# Patient Record
Sex: Male | Born: 1975 | Race: Black or African American | Hispanic: No | Marital: Married | Smoking: Never smoker
Health system: Southern US, Community
[De-identification: ages and names within clinical notes are randomized; demographics above are authoritative.]

## PROBLEM LIST (undated history)

## (undated) DIAGNOSIS — Z789 Other specified health status: Secondary | ICD-10-CM

## (undated) HISTORY — PX: APPENDECTOMY: SHX54

---

## 2016-02-08 ENCOUNTER — Ambulatory Visit
Admission: RE | Admit: 2016-02-08 | Discharge: 2016-02-08 | Disposition: A | Discharge status: Home / Self Care | Payer: BLUE CROSS/BLUE SHIELD | Source: Ambulatory Visit | Attending: Family Medicine | Admitting: Family Medicine

## 2016-02-08 ENCOUNTER — Encounter (HOSPITAL_COMMUNITY): Payer: Self-pay | Admitting: Emergency Medicine

## 2016-02-08 ENCOUNTER — Emergency Department (HOSPITAL_COMMUNITY): Payer: BLUE CROSS/BLUE SHIELD

## 2016-02-08 ENCOUNTER — Other Ambulatory Visit: Payer: Self-pay | Admitting: Family Medicine

## 2016-02-08 ENCOUNTER — Inpatient Hospital Stay (HOSPITAL_COMMUNITY)
Admission: EM | Admit: 2016-02-08 | Discharge: 2016-02-16 | DRG: 988 | Disposition: A | Discharge status: Home / Self Care | Payer: BLUE CROSS/BLUE SHIELD | Attending: Internal Medicine | Admitting: Internal Medicine

## 2016-02-08 DIAGNOSIS — E876 Hypokalemia: Secondary | ICD-10-CM | POA: Diagnosis present

## 2016-02-08 DIAGNOSIS — D6959 Other secondary thrombocytopenia: Secondary | ICD-10-CM | POA: Diagnosis present

## 2016-02-08 DIAGNOSIS — E611 Iron deficiency: Secondary | ICD-10-CM | POA: Diagnosis present

## 2016-02-08 DIAGNOSIS — J9811 Atelectasis: Secondary | ICD-10-CM | POA: Diagnosis present

## 2016-02-08 DIAGNOSIS — R079 Chest pain, unspecified: Secondary | ICD-10-CM

## 2016-02-08 DIAGNOSIS — D509 Iron deficiency anemia, unspecified: Principal | ICD-10-CM | POA: Diagnosis present

## 2016-02-08 DIAGNOSIS — R0789 Other chest pain: Secondary | ICD-10-CM

## 2016-02-08 DIAGNOSIS — D649 Anemia, unspecified: Secondary | ICD-10-CM | POA: Diagnosis present

## 2016-02-08 DIAGNOSIS — R0602 Shortness of breath: Secondary | ICD-10-CM

## 2016-02-08 DIAGNOSIS — K640 First degree hemorrhoids: Secondary | ICD-10-CM | POA: Diagnosis present

## 2016-02-08 DIAGNOSIS — R0902 Hypoxemia: Secondary | ICD-10-CM | POA: Diagnosis present

## 2016-02-08 DIAGNOSIS — R5383 Other fatigue: Secondary | ICD-10-CM | POA: Diagnosis not present

## 2016-02-08 DIAGNOSIS — R918 Other nonspecific abnormal finding of lung field: Secondary | ICD-10-CM | POA: Diagnosis present

## 2016-02-08 DIAGNOSIS — R911 Solitary pulmonary nodule: Secondary | ICD-10-CM | POA: Diagnosis present

## 2016-02-08 DIAGNOSIS — Z9049 Acquired absence of other specified parts of digestive tract: Secondary | ICD-10-CM

## 2016-02-08 DIAGNOSIS — R7989 Other specified abnormal findings of blood chemistry: Secondary | ICD-10-CM

## 2016-02-08 DIAGNOSIS — D696 Thrombocytopenia, unspecified: Secondary | ICD-10-CM

## 2016-02-08 DIAGNOSIS — R109 Unspecified abdominal pain: Secondary | ICD-10-CM

## 2016-02-08 DIAGNOSIS — R791 Abnormal coagulation profile: Secondary | ICD-10-CM | POA: Diagnosis present

## 2016-02-08 HISTORY — DX: Other specified health status: Z78.9

## 2016-02-08 LAB — COMPREHENSIVE METABOLIC PANEL
ALBUMIN: 3.6 g/dL (ref 3.5–5.0)
ALT: 36 U/L (ref 17–63)
ANION GAP: 7 (ref 5–15)
AST: 38 U/L (ref 15–41)
Alkaline Phosphatase: 75 U/L (ref 38–126)
BILIRUBIN TOTAL: 1.3 mg/dL — AB (ref 0.3–1.2)
BUN: 16 mg/dL (ref 6–20)
CHLORIDE: 104 mmol/L (ref 101–111)
CO2: 26 mmol/L (ref 22–32)
Calcium: 9 mg/dL (ref 8.9–10.3)
Creatinine, Ser: 1.08 mg/dL (ref 0.61–1.24)
GFR calc Af Amer: >60 mL/min (ref >60)
GLUCOSE: 112 mg/dL — AB (ref 65–99)
POTASSIUM: 3.6 mmol/L (ref 3.5–5.1)
Sodium: 137 mmol/L (ref 135–145)
TOTAL PROTEIN: 6.6 g/dL (ref 6.5–8.1)

## 2016-02-08 LAB — ABO/RH: ABO/RH(D): O POS

## 2016-02-08 LAB — IRON AND TIBC
IRON: 8 ug/dL — AB (ref 45–182)
Saturation Ratios: 2 % — ABNORMAL LOW (ref 17.9–39.5)
TIBC: 456 ug/dL — AB (ref 250–450)
UIBC: 448 ug/dL

## 2016-02-08 LAB — CBC
HEMATOCRIT: 21.6 % — AB (ref 39.0–52.0)
HEMOGLOBIN: 6.3 g/dL — AB (ref 13.0–17.0)
MCH: 19.4 pg — ABNORMAL LOW (ref 26.0–34.0)
MCHC: 29.2 g/dL — ABNORMAL LOW (ref 30.0–36.0)
MCV: 66.7 fL — ABNORMAL LOW (ref 78.0–100.0)
Platelets: 115 10*3/uL — ABNORMAL LOW (ref 150–400)
RBC: 3.24 MIL/uL — ABNORMAL LOW (ref 4.22–5.81)
RDW: 16.7 % — ABNORMAL HIGH (ref 11.5–15.5)
WBC: 4.1 10*3/uL (ref 4.0–10.5)

## 2016-02-08 LAB — VITAMIN B12: VITAMIN B 12: 485 pg/mL (ref 180–914)

## 2016-02-08 LAB — RETICULOCYTES
RBC.: 3.15 MIL/uL — ABNORMAL LOW (ref 4.22–5.81)
RETIC COUNT ABSOLUTE: 56.7 10*3/uL (ref 19.0–186.0)
RETIC CT PCT: 1.8 % (ref 0.4–3.1)

## 2016-02-08 LAB — FOLATE: Folate: 30.9 ng/mL (ref >5.9)

## 2016-02-08 LAB — I-STAT TROPONIN, ED: Troponin i, poc: 0 ng/mL (ref 0.00–0.08)

## 2016-02-08 LAB — POC OCCULT BLOOD, ED: FECAL OCCULT BLD: NEGATIVE

## 2016-02-08 LAB — TROPONIN I: Troponin I: <0.03 ng/mL (ref <0.03)

## 2016-02-08 LAB — FERRITIN: Ferritin: 3 ng/mL — ABNORMAL LOW (ref 24–336)

## 2016-02-08 LAB — PREPARE RBC (CROSSMATCH)

## 2016-02-08 MED ORDER — ACETAMINOPHEN 325 MG PO TABS
650.0000 mg | ORAL_TABLET | Freq: Four times a day (QID) | ORAL | Status: DC | PRN
Start: 2016-02-08 — End: 2016-02-16
  Administered 2016-02-15: 650 mg via ORAL
  Filled 2016-02-08: qty 2

## 2016-02-08 MED ORDER — ACETAMINOPHEN 650 MG RE SUPP: 650.0000 mg | Freq: Four times a day (QID) | RECTAL | Status: DC | PRN

## 2016-02-08 MED ORDER — ONDANSETRON HCL 4 MG PO TABS
4.0000 mg | ORAL_TABLET | Freq: Four times a day (QID) | ORAL | Status: DC | PRN
Start: 2016-02-08 — End: 2016-02-16

## 2016-02-08 MED ORDER — POLYETHYLENE GLYCOL 3350 17 G PO PACK: 17.0000 g | PACK | Freq: Every day | ORAL | Status: DC | PRN

## 2016-02-08 MED ORDER — SODIUM CHLORIDE 0.9 % IV SOLN: 250.0000 mL | INTRAVENOUS | Status: DC | PRN

## 2016-02-08 MED ORDER — SODIUM CHLORIDE 0.9 % IV SOLN
Freq: Once | INTRAVENOUS | Status: AC
  Administered 2016-02-08: 21:00:00 via INTRAVENOUS

## 2016-02-08 MED ORDER — HYDROCODONE-ACETAMINOPHEN 5-325 MG PO TABS
1.0000 | ORAL_TABLET | ORAL | Status: DC | PRN
  Administered 2016-02-10 – 2016-02-13 (×3): 1 via ORAL
  Administered 2016-02-13: 2 via ORAL
  Filled 2016-02-08 (×3): qty 1
  Filled 2016-02-08: qty 2

## 2016-02-08 MED ORDER — ONDANSETRON HCL 4 MG/2ML IJ SOLN: 4.0000 mg | Freq: Four times a day (QID) | INTRAMUSCULAR | Status: DC | PRN

## 2016-02-08 MED ORDER — IOPAMIDOL (ISOVUE-370) INJECTION 76%
INTRAVENOUS | Status: AC
  Administered 2016-02-08: 100 mL
  Filled 2016-02-08: qty 100

## 2016-02-08 MED ORDER — SODIUM CHLORIDE 0.9% FLUSH: 3.0000 mL | INTRAVENOUS | Status: DC | PRN

## 2016-02-08 MED ORDER — SODIUM CHLORIDE 0.9% FLUSH
3.0000 mL | Freq: Two times a day (BID) | INTRAVENOUS | Status: DC
  Administered 2016-02-08 – 2016-02-16 (×9): 3 mL via INTRAVENOUS

## 2016-02-08 MED ORDER — SODIUM CHLORIDE 0.9 % IV SOLN
Freq: Once | INTRAVENOUS | Status: AC
  Administered 2016-02-08: 23:00:00 via INTRAVENOUS

## 2016-02-08 NOTE — ED Notes (Signed)
Informed Dr. Lynelle DoctorKnapp that pt's father states pt had an elevated D-dimer at PCP.

## 2016-02-08 NOTE — ED Notes (Signed)
Patient transported to X-ray 

## 2016-02-08 NOTE — ED Triage Notes (Signed)
Pt here for CP x 1 month and anemia per PCP; pt sts increased SOB with exertion

## 2016-02-08 NOTE — H&P (Addendum)
History and Physical    Leonard Fowler:[id number] DOB: August 23, 1975 DOA: 02/08/2016  PCP: No PCP Per Patient   Patient coming from: Home   Chief Complaint: Chest pain, exertional dyspnea, fatigue, low Hgb on outpt labs  HPI: Leonard Fowler is a 40 y.o. male with no known past medical history who originates from [place] and has been here for almost one year, now presenting with approximately 1 months of progressive fatigue, exertional dyspnea, constant and vague chest discomfort, and low hemoglobin with elevated d-dimer on outpatient labs. Patient reports enjoying good health until the insidious development of exertional dyspnea and a vague, constant, mild chest discomfort approximately one month ago that has progressively worsened and been associated with general weakness and fatigue. Patient denies any fevers or chills during this interval and as not had any cough. He denies abdominal pain, nausea, vomiting, diarrhea, melena, or hematochezia. He does not take any medications regularly and denies any use of alcohol or illicit substances. He does not know of any family history of thalassemia or anemia. Patient was evaluated earlier today and in outpatient clinic with lab work that was revealing of a low hemoglobin and elevated d-dimer. He was directed to the ED for further evaluation of this.  ED Course: Upon arrival to the ED, patient is found to be afebrile, saturating well on room air, and with vital signs stable. EKG features a normal sinus rhythm and chest x-ray is negative for acute cardiopulmonary disease. Chemistry panel is unremarkable and CBC is notable for hemoglobin of 6.3, platelets 715,000, and MCV of 66.7. Troponin is undetectable 2. Anemia panel was obtained and B12 level was normal, but serum iron is severely low at 8 with ferritin of 3 and elevated TIBC. DRE was unremarkable and stool is FOBT negative. CTA PE study was negative for PE, but notable for multiple pulmonary nodules with  follow-up imaging recommended at 3-6 months with noncontrast chest CT. Patient was typed and screened and 2 units of packed red blood cells were ordered for immediate transfusion. He has remained hemodynamically stable in the ED and not in any apparent respiratory distress. He will be observed on the medical/surgical unit for ongoing evaluation and management of symptomatic anemia with thrombocytopenia suspected secondary to severe iron deficiency.  Review of Systems:  All other systems reviewed and apart from HPI, are negative.  Past Medical History:  Diagnosis Date  . Medical history non-contributory     Past Surgical History:  Procedure Laterality Date  . NO PAST SURGERIES       reports that he has never smoked. He has never used smokeless tobacco. He reports that he does not drink alcohol or use drugs.  No Known Allergies  Family History  Problem Relation Age of Onset  . Anemia Neg Hx   . Cancer Neg Hx      Prior to Admission medications   Medication Sig Start Date End Date Taking? Authorizing Provider  naproxen (NAPROSYN) 500 MG tablet Take 500 mg by mouth 2 (two) times daily with a meal.   Yes Historical Provider, MD    Physical Exam: Vitals:   02/08/16 2130 02/08/16 2135 02/08/16 2135 02/08/16 2145  BP:  108/69  111/67  Pulse:  72  68  Resp:    15  Temp: 98 F (36.7 C)  98 F (36.7 C)   TempSrc:   Oral   SpO2:  100%  100%      Constitutional: NAD, calm, comfortable Eyes: PERTLA, lids normal,  pale conjunctiva  ENMT: Mucous membranes are moist. Posterior pharynx clear of any exudate or lesions.   Neck: normal, supple, no masses, no thyromegaly Respiratory: clear to auscultation bilaterally, no wheezing, no crackles. Normal respiratory effort.   Cardiovascular: S1 & S2 heard, regular rate and rhythm, no significant murmurs. Hyperdynamic precordium. No extremity edema. No significant JVD. Abdomen: No distension, no tenderness, no masses palpated. Bowel sounds  normal.  Musculoskeletal: no clubbing / cyanosis. No joint deformity upper and lower extremities. Normal muscle tone.  Skin: no significant rashes, lesions, ulcers. Warm, dry, well-perfused. Neurologic: CN 2-12 grossly intact. Sensation intact, DTR normal. Strength 5/5 in all 4 limbs.  Psychiatric: Normal judgment and insight. Alert and oriented x 3. Normal mood and affect.     Labs on Admission: I have personally reviewed following labs and imaging studies  CBC:  Recent Labs Lab 02/08/16 1705  WBC 4.1  HGB 6.3*  HCT 21.6*  MCV 66.7*  PLT 115*   Basic Metabolic Panel:  Recent Labs Lab 02/08/16 1705  NA 137  K 3.6  CL 104  CO2 26  GLUCOSE 112*  BUN 16  CREATININE 1.08  CALCIUM 9.0   GFR: CrCl cannot be calculated (Unknown ideal weight.). Liver Function Tests:  Recent Labs Lab 02/08/16 1705  AST 38  ALT 36  ALKPHOS 75  BILITOT 1.3*  PROT 6.6  ALBUMIN 3.6   No results for input(s): LIPASE, AMYLASE in the last 168 hours. No results for input(s): AMMONIA in the last 168 hours. Coagulation Profile: No results for input(s): INR, PROTIME in the last 168 hours. Cardiac Enzymes:  Recent Labs Lab 02/08/16 1705  TROPONINI <0.03   BNP (last 3 results) No results for input(s): PROBNP in the last 8760 hours. HbA1C: No results for input(s): HGBA1C in the last 72 hours. CBG: No results for input(s): GLUCAP in the last 168 hours. Lipid Profile: No results for input(s): CHOL, HDL, LDLCALC, TRIG, CHOLHDL, LDLDIRECT in the last 72 hours. Thyroid Function Tests: No results for input(s): TSH, T4TOTAL, FREET4, T3FREE, THYROIDAB in the last 72 hours. Anemia Panel:  Recent Labs  02/08/16 1844  VITAMINB12 485  FOLATE 30.9  FERRITIN 3*  TIBC 456*  IRON 8*  RETICCTPCT 1.8   Urine analysis: No results found for: COLORURINE, APPEARANCEUR, LABSPEC, PHURINE, GLUCOSEU, HGBUR, BILIRUBINUR, KETONESUR, PROTEINUR, UROBILINOGEN, NITRITE, LEUKOCYTESUR Sepsis  Labs: @LABRCNTIP (procalcitonin:4,lacticidven:4) )No results found for this or any previous visit (from the past 240 hour(s)).   Radiological Exams on Admission: Dg Chest 2 View  Result Date: 02/08/2016 CLINICAL DATA:  Chest pain, anemia, shortness of breath. EXAM: CHEST  2 VIEW COMPARISON:  Chest CT earlier this day. FINDINGS: The cardiomediastinal contours are normal. The lungs are clear. Pulmonary nodules on CT are not well seen radiographically. Pulmonary vasculature is normal. No consolidation, pleural effusion, or pneumothorax. No acute osseous abnormalities are seen. IMPRESSION: No active cardiopulmonary disease. Pulmonary nodules on CT are not well seen radiographically. Electronically Signed   By: Rubye OaksMelanie  Ehinger M.D.   On: 02/08/2016 21:05   Dg Chest 2 View  Result Date: 02/08/2016 CLINICAL DATA:  Dyspnea on exertion with chest pain x1 month EXAM: CHEST  2 VIEW COMPARISON:  None. FINDINGS: The heart size and mediastinal contours are within normal limits. Both lungs are clear. The visualized skeletal structures are unremarkable. IMPRESSION: No active cardiopulmonary disease. Electronically Signed   By: Tollie Ethavid  Kwon M.D.   On: 02/08/2016 13:47   Ct Angio Chest Pe W And/or Wo Contrast  Result  Date: 02/08/2016 CLINICAL DATA:  Chest pain and elevated D-dimer. Shortness of breath for 1 month. EXAM: CT ANGIOGRAPHY CHEST WITH CONTRAST TECHNIQUE: Multidetector CT imaging of the chest was performed using the standard protocol during bolus administration of intravenous contrast. Multiplanar CT image reconstructions and MIPs were obtained to evaluate the vascular anatomy. CONTRAST:  100 cc Isovue 370 IV COMPARISON:  Chest radiograph earlier this day. FINDINGS: Cardiovascular: Normal caliber isThere are no filling defects within the pulmonary arteries to suggest pulmonary embolus. Normal caliber thoracic aorta without evidence of dissection. Normal heart size. No pericardial effusion. Mediastinum/Nodes:  No mediastinal or hilar adenopathy. Small bilateral axillary nodes are not enlarged by size criteria. Mild thickening of the distal esophagus. Visualized thyroid gland is normal. Trachea is midline. Lungs/Pleura: Mild dependent atelectasis. In the perifissural left upper lobe there is a 8 x 7 mm nodule (8 mm mean diameter) on image 59 of series 5. There is a 4 mm right middle lobe nodule image 74. There are multiple bilateral perifissural and subpleural nodules measuring less than 5 mm. No pleural fluid. Upper Abdomen: Stomach is distended with ingested contents. No definite acute abnormality, paucity of intra-abdominal fat limits upper abdominal assessment. Musculoskeletal: There are no acute or suspicious osseous abnormalities. Review of the MIP images confirms the above findings. IMPRESSION: 1. No pulmonary embolus.  No acute intrathoracic abnormality. 2. **An incidental finding of potential clinical significance has been found. Multiple pulmonary nodules, majority are perifissural, largest measuring 8 mm. Non-contrast chest CT at 3-6 months is recommended. If the nodules are stable at time of repeat CT, then future CT at 18-24 months (from today's scan) is considered optional for low-risk patients, but is recommended for high-risk patients. This recommendation follows the consensus statement: Guidelines for Management of Incidental Pulmonary Nodules Detected on CT Images: From the Fleischner Society 2017; Radiology 2017; 284:228-243.** Electronically Signed   By: Rubye Oaks M.D.   On: 02/08/2016 20:59    EKG: Independently reviewed. Normal sinus rhythm, no prior for comparison  Assessment/Plan  1. Symptomatic anemia  - Pt presents with 1 month of progressive exertional dyspnea and fatigue; he was evaluated with outpatient labs earlier in the day which reportedly featured anemia and elevated d-dimer and he was directed to the ED - Hgb is 6.3 on admission with MCV of 66.7 and no priors for  comparison  - Anemia panel reveals normal B12 level and severe iron-deficiency  - Pt denies melena, hematochezia, or hematuria, and FOBT is negative  - 2 units of packed RBCs ordered for immediate transfusion and RN asked to place order for post-transfusion CBC    2. Iron-deficiency, severe  - Anemia panel is consistent with severe iron-deficiency  - There does not appear to be any bleeding and pt denies melena, hematochezia, or hematuria - He has recently moved here from [place] and hookworm is a potential etiology; ?whether the incidental pulm nodules could represent pulmonary involvement of hookworm; unfortunately, there was no differential on CBC to look for eosinophilia, will ask lab to add-on; could consider stool ova & parasite exam  - Following stabilization with pRBC transfusion, can decide on oral vs IV iron    3. Thrombocytopenia - Likely secondary to severe iron-deficiency  - There is no evidence for bleeding  - Repeat CBC will be obtained following transfusion, and if thrombocytopenia confirmed, could likely monitor for response to iron-supplementation   4. Chest pain, dyspnea  - Pt reports a vague and constant CP for the past month with  no alleviated or exacerbating factors; no worse with exertion  - CXR is negative for acute cardiopulmonary disease and initial EKG and troponin are reassuring; PE ruled-out with CTA in ED  - With pain for a month, but no known cardiac risk factors and normal EKG and undetectable troponin x2, ACS is excluded  - Exertional dyspnea suspected secondary to profound anemia and anticipate resolution with treatment -   5. Incidental pulmonary nodules - CTA PE study obtained in ED, while negative for PE, is notable for multiple nodules - Follow-up with non-contrast chest CT is recommended at 3-6 mos, pt was made aware   DVT prophylaxis: SCDs Code Status: Full  Family Communication: Family friend updated at bedside at patient's request Disposition  Plan: Observe on med-surg Consults called: None Admission status: Observation    Briscoe Deutscherimothy S Opyd, MD [facility] Pager [phone]  If 7PM-7AM, please contact night-coverage [email or web address] Password [email or web address]  02/08/2016, 10:05 PM

## 2016-02-08 NOTE — ED Notes (Signed)
Patient transported to CT 

## 2016-02-08 NOTE — ED Provider Notes (Signed)
[facility]-EMERGENCY DEPT Provider Note   CSN: [id number] Arrival date & time: 02/08/16  1627     History   Chief Complaint Chief Complaint  Patient presents with  . Chest Pain  . Abnormal Lab    HPI Golden PopJohn K Quinter is a 40 y.o. male. Translator used to provide history HPI Pt presents to the ED with complaints of chest pain that started over a month ago.   It is a constant pain that is hard to characterize.  He has also noticed shortness of breath over the last month with exertion.  NO fevers.  No vomiting or diarrhea.  Denies blood in the stool or melena.  He went to an [facility] today and was sent in to the ED because of anemia and elevated d dimer. History reviewed. No pertinent past medical history.  Patient Active Problem List   Diagnosis Date Noted  . Symptomatic anemia 02/08/2016  . Iron deficiency 02/08/2016  . Thrombocytopenia (HCC) 02/08/2016  . Chest pain 02/08/2016  . Incidental pulmonary nodule 02/08/2016    History reviewed. No pertinent surgical history.     Home Medications    Prior to Admission medications   Medication Sig Start Date End Date Taking? Authorizing Provider  naproxen (NAPROSYN) 500 MG tablet Take 500 mg by mouth 2 (two) times daily with a meal.   Yes Historical Provider, MD    Family History History reviewed. No pertinent family history.  Social History Social History  Substance Use Topics  . Smoking status: Never Smoker  . Smokeless tobacco: Never Used  . Alcohol use No     Allergies   Patient has no known allergies.   Review of Systems Review of Systems  All other systems reviewed and are negative.    Physical Exam Updated Vital Signs BP 115/76   Pulse 69   Temp 98.1 F (36.7 C) (Oral)   Resp 16   SpO2 100%   Physical Exam  Constitutional: He appears well-developed and well-nourished. No distress.  HENT:  Head: Normocephalic and atraumatic.  Right Ear: External ear normal.  Left Ear: External ear normal.    Eyes: Conjunctivae are normal. Right eye exhibits no discharge. Left eye exhibits no discharge. No scleral icterus.  Neck: Neck supple. No tracheal deviation present.  Cardiovascular: Normal rate, regular rhythm and intact distal pulses.   Pulmonary/Chest: Effort normal and breath sounds normal. No stridor. No respiratory distress. He has no wheezes. He has no rales.  Abdominal: Soft. Bowel sounds are normal. He exhibits no distension. There is no tenderness. There is no rebound and no guarding.  Musculoskeletal: He exhibits no edema or tenderness.  Neurological: He is alert. He has normal strength. No cranial nerve deficit (no facial droop, extraocular movements intact, no slurred speech) or sensory deficit. He exhibits normal muscle tone. He displays no seizure activity. Coordination normal.  Skin: Skin is warm and dry. No rash noted.  Psychiatric: He has a normal mood and affect.  Nursing note and vitals reviewed.    ED Treatments / Results  Labs (all labs ordered are listed, but only abnormal results are displayed) Labs Reviewed  COMPREHENSIVE METABOLIC PANEL - Abnormal; Notable for the following:       Result Value   Glucose, Bld 112 (*)    Total Bilirubin 1.3 (*)    All other components within normal limits  CBC - Abnormal; Notable for the following:    RBC 3.24 (*)    Hemoglobin 6.3 (*)  HCT 21.6 (*)    MCV 66.7 (*)    MCH 19.4 (*)    MCHC 29.2 (*)    RDW 16.7 (*)    Platelets 115 (*)    All other components within normal limits  IRON AND TIBC - Abnormal; Notable for the following:    Iron 8 (*)    TIBC 456 (*)    Saturation Ratios 2 (*)    All other components within normal limits  FERRITIN - Abnormal; Notable for the following:    Ferritin 3 (*)    All other components within normal limits  RETICULOCYTES - Abnormal; Notable for the following:    RBC. 3.15 (*)    All other components within normal limits  TROPONIN I  VITAMIN B12  FOLATE  I-STAT TROPOININ, ED   POC OCCULT BLOOD, ED  TYPE AND SCREEN  ABO/RH  PREPARE RBC (CROSSMATCH)    EKG  EKG Interpretation  Date/Time:  Tuesday February 08 2016 16:32:32 EST Ventricular Rate:  78 PR Interval:  158 QRS Duration: 102 QT Interval:  382 QTC Calculation: 435 R Axis:   46 Text Interpretation:  Normal sinus rhythm Possible Left atrial enlargement Incomplete right bundle branch block Borderline ECG No old tracing to compare Confirmed by Ruthvik Barnaby  MD-J, Syeda Prickett ([id number]) on 02/08/2016 6:28:50 PM       Radiology Dg Chest 2 View  Result Date: 02/08/2016 CLINICAL DATA:  Chest pain, anemia, shortness of breath. EXAM: CHEST  2 VIEW COMPARISON:  Chest CT earlier this day. FINDINGS: The cardiomediastinal contours are normal. The lungs are clear. Pulmonary nodules on CT are not well seen radiographically. Pulmonary vasculature is normal. No consolidation, pleural effusion, or pneumothorax. No acute osseous abnormalities are seen. IMPRESSION: No active cardiopulmonary disease. Pulmonary nodules on CT are not well seen radiographically. Electronically Signed   By: Rubye Oaks M.D.   On: 02/08/2016 21:05   Dg Chest 2 View  Result Date: 02/08/2016 CLINICAL DATA:  Dyspnea on exertion with chest pain x1 month EXAM: CHEST  2 VIEW COMPARISON:  None. FINDINGS: The heart size and mediastinal contours are within normal limits. Both lungs are clear. The visualized skeletal structures are unremarkable. IMPRESSION: No active cardiopulmonary disease. Electronically Signed   By: Tollie Eth M.D.   On: 02/08/2016 13:47   Ct Angio Chest Pe W And/or Wo Contrast  Result Date: 02/08/2016 CLINICAL DATA:  Chest pain and elevated D-dimer. Shortness of breath for 1 month. EXAM: CT ANGIOGRAPHY CHEST WITH CONTRAST TECHNIQUE: Multidetector CT imaging of the chest was performed using the standard protocol during bolus administration of intravenous contrast. Multiplanar CT image reconstructions and MIPs were obtained to evaluate the  vascular anatomy. CONTRAST:  100 cc Isovue 370 IV COMPARISON:  Chest radiograph earlier this day. FINDINGS: Cardiovascular: Normal caliber isThere are no filling defects within the pulmonary arteries to suggest pulmonary embolus. Normal caliber thoracic aorta without evidence of dissection. Normal heart size. No pericardial effusion. Mediastinum/Nodes: No mediastinal or hilar adenopathy. Small bilateral axillary nodes are not enlarged by size criteria. Mild thickening of the distal esophagus. Visualized thyroid gland is normal. Trachea is midline. Lungs/Pleura: Mild dependent atelectasis. In the perifissural left upper lobe there is a 8 x 7 mm nodule (8 mm mean diameter) on image 59 of series 5. There is a 4 mm right middle lobe nodule image 74. There are multiple bilateral perifissural and subpleural nodules measuring less than 5 mm. No pleural fluid. Upper Abdomen: Stomach is distended with ingested contents. No  definite acute abnormality, paucity of intra-abdominal fat limits upper abdominal assessment. Musculoskeletal: There are no acute or suspicious osseous abnormalities. Review of the MIP images confirms the above findings. IMPRESSION: 1. No pulmonary embolus.  No acute intrathoracic abnormality. 2. **An incidental finding of potential clinical significance has been found. Multiple pulmonary nodules, majority are perifissural, largest measuring 8 mm. Non-contrast chest CT at 3-6 months is recommended. If the nodules are stable at time of repeat CT, then future CT at 18-24 months (from today's scan) is considered optional for low-risk patients, but is recommended for high-risk patients. This recommendation follows the consensus statement: Guidelines for Management of Incidental Pulmonary Nodules Detected on CT Images: From the Fleischner Society 2017; Radiology 2017; 284:228-243.** Electronically Signed   By: Rubye OaksMelanie  Ehinger M.D.   On: 02/08/2016 20:59    Procedures Procedures (including critical care  time)  Medications Ordered in ED Medications  iopamidol (ISOVUE-370) 76 % injection (100 mLs  Contrast Given 02/08/16 2008)  0.9 %  sodium chloride infusion ( Intravenous New Bag/Given 02/08/16 2110)     Initial Impression / Assessment and Plan / ED Course  I have reviewed the triage vital signs and the nursing notes.  Pertinent labs & imaging results that were available during my care of the patient were reviewed by me and considered in my medical decision making (see chart for details).  Clinical Course as of Feb 08 2128  Tue Feb 08, 2016  1828 Labs show significant anemia.  Low MCV suggests chronic component.  [JK]  1829 Trop is pending.  WIll add on hemocult.  CT angio for pe because of the elevated d dimer.  Send of anemia panel and type and screen.  [JK]    Clinical Course User Index [JK] Linwood DibblesJon Mikaela Hilgeman, MD   CT scan without acute PE.  Incidental nodules noted.  Pt does have profound anemia.  Iron and ferritin are low.  Suspect chronic component. Transfusions ordered.  Pt takes nsaids but no acute GI bleeding.    Will admit for further workup.  Final Clinical Impressions(s) / ED Diagnoses   Final diagnoses:  Anemia, unspecified type  Pulmonary nodules    New Prescriptions New Prescriptions   No medications on file     Linwood DibblesJon Riyanna Crutchley, MD 02/08/16 2131

## 2016-02-08 NOTE — ED Notes (Addendum)
Received critical low hgb - 6.2.  [place]

## 2016-02-09 ENCOUNTER — Encounter (HOSPITAL_COMMUNITY): Payer: Self-pay | Admitting: Physician Assistant

## 2016-02-09 DIAGNOSIS — E611 Iron deficiency: Secondary | ICD-10-CM

## 2016-02-09 DIAGNOSIS — D649 Anemia, unspecified: Secondary | ICD-10-CM | POA: Diagnosis not present

## 2016-02-09 LAB — CBC WITH DIFFERENTIAL/PLATELET
BASOS ABS: 0.1 10*3/uL (ref 0.0–0.1)
Basophils Relative: 1 %
EOS ABS: 0.2 10*3/uL (ref 0.0–0.7)
EOS PCT: 3 %
HCT: 28.4 % — ABNORMAL LOW (ref 39.0–52.0)
Hemoglobin: 8.7 g/dL — ABNORMAL LOW (ref 13.0–17.0)
LYMPHS PCT: 23 %
Lymphs Abs: 1.2 10*3/uL (ref 0.7–4.0)
MCH: 21.6 pg — ABNORMAL LOW (ref 26.0–34.0)
MCHC: 30.6 g/dL (ref 30.0–36.0)
MCV: 70.5 fL — AB (ref 78.0–100.0)
MONO ABS: 0.3 10*3/uL (ref 0.1–1.0)
Monocytes Relative: 6 %
Neutro Abs: 3.5 10*3/uL (ref 1.7–7.7)
Neutrophils Relative %: 67 %
PLATELETS: 117 10*3/uL — AB (ref 150–400)
RBC: 4.03 MIL/uL — AB (ref 4.22–5.81)
RDW: 18.5 % — AB (ref 11.5–15.5)
WBC: 5.3 10*3/uL (ref 4.0–10.5)

## 2016-02-09 LAB — TYPE AND SCREEN
BLOOD PRODUCT EXPIRATION DATE: 201712202359
Blood Product Expiration Date: 201712202359
ISSUE DATE / TIME: 201712052112
ISSUE DATE / TIME: 201712052308
UNIT TYPE AND RH: 5100
Unit Type and Rh: 5100

## 2016-02-09 LAB — BASIC METABOLIC PANEL
ANION GAP: 5 (ref 5–15)
BUN: 13 mg/dL (ref 6–20)
CALCIUM: 8.9 mg/dL (ref 8.9–10.3)
CO2: 27 mmol/L (ref 22–32)
Chloride: 105 mmol/L (ref 101–111)
Creatinine, Ser: 0.92 mg/dL (ref 0.61–1.24)
GFR calc Af Amer: >60 mL/min (ref >60)
GLUCOSE: 111 mg/dL — AB (ref 65–99)
Potassium: 3.7 mmol/L (ref 3.5–5.1)
SODIUM: 137 mmol/L (ref 135–145)

## 2016-02-09 LAB — PROTIME-INR
INR: 1.09
Prothrombin Time: 14.2 seconds (ref 11.4–15.2)

## 2016-02-09 LAB — APTT: aPTT: 31 seconds (ref 24–36)

## 2016-02-09 LAB — TSH: TSH: 4.048 u[IU]/mL (ref 0.350–4.500)

## 2016-02-09 MED ORDER — FERROUS SULFATE 325 (65 FE) MG PO TABS
325.0000 mg | ORAL_TABLET | Freq: Two times a day (BID) | ORAL | Status: DC
  Administered 2016-02-09: 325 mg via ORAL
  Filled 2016-02-09 (×2): qty 1

## 2016-02-09 MED ORDER — PANTOPRAZOLE SODIUM 40 MG PO TBEC
40.0000 mg | DELAYED_RELEASE_TABLET | Freq: Every day | ORAL | Status: DC
  Administered 2016-02-09 – 2016-02-16 (×6): 40 mg via ORAL
  Filled 2016-02-09 (×7): qty 1

## 2016-02-09 NOTE — Consult Note (Signed)
[facility] Consult: 12:45 PM 02/09/2016  LOS: 0 days    Referring Provider: Dr Sunnie Nielsenegalado  Primary Care Physician:  No PCP Per Patient Primary Gastroenterologist:  Gentry FitzUnassigned.      Reason for Consultation:  Microcytic anemia. FOBT negative.   HPI: Leonard Fowler is a 40 y.o. male.  Patient is an immigrant from the [place], he speaks [name].  He came to the [place] about a year ago.  No significant past medical history.  S/p appendectomy in the 1990s.    Presented to the emergency room with ~ 4 weeks progressive fatigue, DOE, vague chest discomfort.  For ~ 4 weeks also having intermittent loose, black stools but no obvious blood.  No nausea or emesis, no heartburn.  When he eats, it would trigger rapid BMs.   Due to fatigue and headaches, had to stop his work in [employer].  Took 400 mg Ibuprofen daily for ~ 2 weeks for the headaches, but stopped taking this 1 week ago.     Labs in ED notable for microcytic anemia. Hemoglobin 6.3, MCV 66.   Also thrombocytopenic with platelets of 115.  His ferritin is 3, iron is 8.  Coags normal. TSH normal.  D-dimer said to be elevated, though I am unable to locate this lab in Epic. Cardiac enzymes not elevated. Chemistries notable for glucose 112 and total bilirubin 1.3. Otherwise LFTs and chemistries normal. FOBT negative.  Patient received PRBC 2, hgb now up to 8.7.    CT angiogram of the chest Negative for PE. Multiple pulmonary nodules noted On CXR pulmonary nodules not seen and study without active cardiopulmonary disease  Patient is not aware of any family history of anemia, thalassemia, cancers, ulcer disease.  No epistaxis. + oral bleeding with brushing his teeth a week or so ago.  No epistaxis.  No ETOH. No tobacco.    Married.     Past Medical History:    Diagnosis Date  . Medical history non-contributory     Past Surgical History:  Procedure Laterality Date  . APPENDECTOMY  1990s    Prior to Admission medications   Medication Sig Start Date End Date Taking? Authorizing Provider  naproxen (NAPROSYN) 500 MG tablet Take 500 mg by mouth 2 (two) times daily with a meal.   Yes Historical Provider, MD    Scheduled Meds: . ferrous sulfate  325 mg Oral BID WC  . pantoprazole  40 mg Oral Daily  . sodium chloride flush  3 mL Intravenous Q12H   Infusions:  PRN Meds: sodium chloride, acetaminophen **OR** acetaminophen, HYDROcodone-acetaminophen, ondansetron **OR** ondansetron (ZOFRAN) IV, polyethylene glycol, sodium chloride flush   Allergies as of 02/08/2016  . (No Known Allergies)    Family History  Problem Relation Age of Onset  . Anemia Neg Hx   . Cancer Neg Hx     Social History   Social History  . Marital status: Married    Spouse name: N/A  . Number of children: N/A  . Years of education: N/A   Occupational History  . Not  on file.   Social History Main Topics  . Smoking status: Never Smoker  . Smokeless tobacco: Never Used  . Alcohol use No  . Drug use: No  . Sexual activity: Not on file   Other Topics Concern  . Not on file   Social History Narrative  . No narrative on file    REVIEW OF SYSTEMS: Constitutional:  Weakness and fatigue.  No falls ENT:  No nose bleeds Pulm:  DOE as per HPI CV:  No palpitations, no LE edema. Vague, not always exertional, chest pain GU:  No hematuria, no frequency GI:  Per HPI Heme:  Per HPI.   Transfusions:  None ever before Neuro:  No headaches, no peripheral tingling or numbness Derm:  No itching, no rash or sores.  Endocrine:  No sweats or chills.  No polyuria or dysuria Immunization:  Not queried Travel:  None beyond local counties in last few months.    PHYSICAL EXAM: Vital signs in last 24 hours: Vitals:   02/09/16 0217 02/09/16 0435  BP: 113/73 106/70   Pulse: 62 65  Resp: 18 18  Temp: 97.8 F (36.6 C) 98.4 F (36.9 C)   Wt Readings from Last 3 Encounters:  02/09/16 68.3 kg (150 lb 8 oz)    General: pleasant, comfortable.  Not ill looking Head:  No asymmetry, swelling or head trauma  Eyes:  No icterus or conj pallor Ears:  Not HOH  Nose:  No discharge or dried blood Mouth:  Moist and clear oral MM.  No sores.   Neck:  No JVD or masses or TMG Lungs:  Clear bil.  No cough or dyspnea Heart: RRR.  No MRG.  S1, s2 present Abdomen:  Soft, ND.  Slight upper/epigastric tenderness.  No mass or HSM.  No bruits, no hernias.  appy scar in RLQ that I almost mistook for right inguinal hernia repair scar.   Rectal: no mass, no stool.     Musc/Skeltl: no joint swelling, redness or pain Extremities:  No CCE  Neurologic:  Alert, cooperative, oriented x 2.  Fluid speech.   Skin:  No rash or sores Tattoos:  None observed Nodes:  No cervical adenopathy   Psych:  Pleasant, cooperative, normal affect.    Intake/Output from previous day: 12/05 0701 - 12/06 0700 In: 1389 [I.V.:49; Blood:1340] Out: -  Intake/Output this shift: Total I/O In: 360 [P.O.:360] Out: -   LAB RESULTS:  Recent Labs  02/08/16 1705 02/09/16 0324  WBC 4.1 5.3  HGB 6.3* 8.7*  HCT 21.6* 28.4*  PLT 115* 117*   BMET Lab Results  Component Value Date   NA 137 02/09/2016   NA 137 02/08/2016   K 3.7 02/09/2016   K 3.6 02/08/2016   CL 105 02/09/2016   CL 104 02/08/2016   CO2 27 02/09/2016   CO2 26 02/08/2016   GLUCOSE 111 (H) 02/09/2016   GLUCOSE 112 (H) 02/08/2016   BUN 13 02/09/2016   BUN 16 02/08/2016   CREATININE 0.92 02/09/2016   CREATININE 1.08 02/08/2016   CALCIUM 8.9 02/09/2016   CALCIUM 9.0 02/08/2016   LFT  Recent Labs  02/08/16 1705  PROT 6.6  ALBUMIN 3.6  AST 38  ALT 36  ALKPHOS 75  BILITOT 1.3*   PT/INR Lab Results  Component Value Date   INR 1.09 02/09/2016   Hepatitis Panel No results for input(s): HEPBSAG, HCVAB, HEPAIGM,  HEPBIGM in the last 72 hours. C-Diff No components found for: CDIFF Lipase  No  results found for: LIPASE  Drugs of Abuse  No results found for: LABOPIA, COCAINSCRNUR, LABBENZ, AMPHETMU, THCU, LABBARB   RADIOLOGY STUDIES: Dg Chest 2 View  Result Date: 02/08/2016 CLINICAL DATA:  Chest pain, anemia, shortness of breath. EXAM: CHEST  2 VIEW COMPARISON:  Chest CT earlier this day. FINDINGS: The cardiomediastinal contours are normal. The lungs are clear. Pulmonary nodules on CT are not well seen radiographically. Pulmonary vasculature is normal. No consolidation, pleural effusion, or pneumothorax. No acute osseous abnormalities are seen. IMPRESSION: No active cardiopulmonary disease. Pulmonary nodules on CT are not well seen radiographically. Electronically Signed   By: Rubye Oaks M.D.   On: 02/08/2016 21:05   Dg Chest 2 View  Result Date: 02/08/2016 CLINICAL DATA:  Dyspnea on exertion with chest pain x1 month EXAM: CHEST  2 VIEW COMPARISON:  None. FINDINGS: The heart size and mediastinal contours are within normal limits. Both lungs are clear. The visualized skeletal structures are unremarkable. IMPRESSION: No active cardiopulmonary disease. Electronically Signed   By: Tollie Eth M.D.   On: 02/08/2016 13:47   Ct Angio Chest Pe W And/or Wo Contrast  Result Date: 02/08/2016 CLINICAL DATA:  Chest pain and elevated D-dimer. Shortness of breath for 1 month. EXAM: CT ANGIOGRAPHY CHEST WITH CONTRAST TECHNIQUE: Multidetector CT imaging of the chest was performed using the standard protocol during bolus administration of intravenous contrast. Multiplanar CT image reconstructions and MIPs were obtained to evaluate the vascular anatomy. CONTRAST:  100 cc Isovue 370 IV COMPARISON:  Chest radiograph earlier this day. FINDINGS: Cardiovascular: Normal caliber isThere are no filling defects within the pulmonary arteries to suggest pulmonary embolus. Normal caliber thoracic aorta without evidence of dissection.  Normal heart size. No pericardial effusion. Mediastinum/Nodes: No mediastinal or hilar adenopathy. Small bilateral axillary nodes are not enlarged by size criteria. Mild thickening of the distal esophagus. Visualized thyroid gland is normal. Trachea is midline. Lungs/Pleura: Mild dependent atelectasis. In the perifissural left upper lobe there is a 8 x 7 mm nodule (8 mm mean diameter) on image 59 of series 5. There is a 4 mm right middle lobe nodule image 74. There are multiple bilateral perifissural and subpleural nodules measuring less than 5 mm. No pleural fluid. Upper Abdomen: Stomach is distended with ingested contents. No definite acute abnormality, paucity of intra-abdominal fat limits upper abdominal assessment. Musculoskeletal: There are no acute or suspicious osseous abnormalities. Review of the MIP images confirms the above findings. IMPRESSION: 1. No pulmonary embolus.  No acute intrathoracic abnormality. 2. **An incidental finding of potential clinical significance has been found. Multiple pulmonary nodules, majority are perifissural, largest measuring 8 mm. Non-contrast chest CT at 3-6 months is recommended. If the nodules are stable at time of repeat CT, then future CT at 18-24 months (from today's scan) is considered optional for low-risk patients, but is recommended for high-risk patients. This recommendation follows the consensus statement: Guidelines for Management of Incidental Pulmonary Nodules Detected on CT Images: From the Fleischner Society 2017; Radiology 2017; 284:228-243.** Electronically Signed   By: Rubye Oaks M.D.   On: 02/08/2016 20:59     IMPRESSION:   *  Symptomatic, microcytic anemia.  FOBT negative but reports intermittent looser and black stools.  Status post PRBC 2 and twice a day oral iron tablets now in place. Was using moderate, 400 mg daily, dose Ibuprofen for a couple of weeks, stopping this ~ 1 week ago, so ulcer disease is possible.  ? Hematologic disorder?    *  Thrombocytopenia.  Raises ? of liver disease vs underlying hematologic disorder. However coags are normal.   *  [name] speaking pt.  Language barrier overcome using phone translator.    PLAN:     *  ? Pursue EGD and/or colonoscopy?  In case this is plan, did discuss the procedures and benefits, risks, need for bowel prep with pt who consents to both if necessary.  *  Continue empiric po Protonix, iron.   *  As LFTs haven't been obtained, ordered hepatic function profile for the morning.   Jennye MoccasinSarah Gribbin  02/09/2016, 12:45 PM Pager: [phone]      Attending physician's note   I have taken a history, examined the patient and reviewed the chart. I agree with the Advanced Practitioner's note, impression and recommendations. 40 year old male from the [place] who speaks [name] and is other wise healthy. He is admitted with a severe iron deficiency anemia with heme negative stool. History of intermittent black stools. Colonoscopy and EGD are indicated. Avoid ASA/NSAIDs for now. PPI daily. Friday would be the soonest as inpatient however it would be appropriate to be schedule colonoscopy and EGD as outpatient. Will discuss via interpreter tomorrow.  Claudette HeadMalcolm Braedin Millhouse, MD Clementeen GrahamFACG [phone] Mon-Fri 8a-5p [phone] after 5p, weekends, holidays

## 2016-02-09 NOTE — Progress Notes (Signed)
Pt admitted to 5w38 from ED. Pt's brother in law translating to pt. Pt speaks [name]. Pt is A&Ox4. Pt lives at home with wife. Pt's skin is intact but dry. Pt states no pain. Pt and brother in law oriented to room. Told to call for assistance. Pt stated understanding. Will continue to monitor pt. Nelda MarseilleJenny Thacker, RN

## 2016-02-09 NOTE — Progress Notes (Signed)
PROGRESS NOTE    Leonard Fowler  VWU:[id number] DOB: October 11, 1975 DOA: 02/08/2016 PCP: No PCP Per Patient    Brief Narrative:  Leonard Fowler is a 40 y.o. male with no known past medical history who originates from [place] and has been here for almost one year, now presenting with approximately 1 months of progressive fatigue, exertional dyspnea, constant and vague chest discomfort, and low hemoglobin with elevated d-dimer on outpatient labs. Patient reports enjoying good health until the insidious development of exertional dyspnea and a vague, constant, mild chest discomfort approximately one month ago that has progressively worsened and been associated with general weakness and fatigue. Patient denies any fevers or chills during this interval and as not had any cough. He denies abdominal pain, nausea, vomiting, diarrhea, melena, or hematochezia. He does not take any medications regularly and denies any use of alcohol or illicit substances. He does not know of any family history of thalassemia or anemia. Patient was evaluated earlier today and in outpatient clinic with lab work that was revealing of a low hemoglobin and elevated d-dimer. He was directed to the ED for further evaluation of this.   Assessment & Plan:   Principal Problem:   Symptomatic anemia Active Problems:   Iron deficiency   Thrombocytopenia (HCC)   Chest pain   Incidental pulmonary nodule   Anemia  1-Anemia; Iron deficiency.  Patient denies, any skin rash, pruritus or any worm contact. No eosinophilia.  He report some abdominal pain, and BM soon after eating, black stool.  Will consult GI for further evaluation.  Start  Oral ferrous sulfate.   2-Thrombocytopenia;   Treat iron deficiency.   3-Bilateral Pulmonary nodule. Discussed with patient regarding CT scan follow up in 3 months.   4-Chest pain, dyspnea; related to anemia, improved.     DVT prophylaxis: scd Code Status: full code.  Family Communication: care  discussed with patient.  Disposition Plan: GI evaluation.   Consultants:   GI      Procedures: none   Antimicrobials: none   Subjective: He is feeling better, but still tired.  He report abdominal pain after eating, and black stool.   Objective: Vitals:   02/09/16 0145 02/09/16 0200 02/09/16 0217 02/09/16 0435  BP: 110/82 116/83 113/73 106/70  Pulse: 71 69 62 65  Resp: 15 17 18 18   Temp:   97.8 F (36.6 C) 98.4 F (36.9 C)  TempSrc:   Oral Oral  SpO2: 100% 100% 100% 99%  Weight:    68.3 kg (150 lb 8 oz)  Height:    5\' 6"  (1.676 m)    Intake/Output Summary (Last 24 hours) at 02/09/16 0945 Last data filed at 02/09/16 0205  Gross per 24 hour  Intake             1389 ml  Output                0 ml  Net             1389 ml   Filed Weights   02/09/16 0435  Weight: 68.3 kg (150 lb 8 oz)    Examination:  General exam: Appears calm and comfortable  Respiratory system: Clear to auscultation. Respiratory effort normal. Cardiovascular system: S1 & S2 heard, RRR. No JVD, murmurs, rubs, gallops or clicks. No pedal edema. Gastrointestinal system: Abdomen is nondistended, soft and nontender. No organomegaly or masses felt. Normal bowel sounds heard. Central nervous system: Alert and oriented. No focal neurological deficits. Extremities: Symmetric  5 x 5 power. Skin: No rashes, lesions or ulcers Psychiatry: Judgement and insight appear normal. Mood & affect appropriate.     Data Reviewed: I have personally reviewed following labs and imaging studies  CBC:  Recent Labs Lab 02/08/16 1705 02/09/16 0324  WBC 4.1 5.3  NEUTROABS  --  3.5  HGB 6.3* 8.7*  HCT 21.6* 28.4*  MCV 66.7* 70.5*  PLT 115* 117*   Basic Metabolic Panel:  Recent Labs Lab 02/08/16 1705 02/09/16 0324  NA 137 137  K 3.6 3.7  CL 104 105  CO2 26 27  GLUCOSE 112* 111*  BUN 16 13  CREATININE 1.08 0.92  CALCIUM 9.0 8.9   GFR: Estimated Creatinine Clearance: 95.4 mL/min (by C-G formula  based on SCr of 0.92 mg/dL). Liver Function Tests:  Recent Labs Lab 02/08/16 1705  AST 38  ALT 36  ALKPHOS 75  BILITOT 1.3*  PROT 6.6  ALBUMIN 3.6   No results for input(s): LIPASE, AMYLASE in the last 168 hours. No results for input(s): AMMONIA in the last 168 hours. Coagulation Profile:  Recent Labs Lab 02/09/16 0324  INR 1.09   Cardiac Enzymes:  Recent Labs Lab 02/08/16 1705  TROPONINI <0.03   BNP (last 3 results) No results for input(s): PROBNP in the last 8760 hours. HbA1C: No results for input(s): HGBA1C in the last 72 hours. CBG: No results for input(s): GLUCAP in the last 168 hours. Lipid Profile: No results for input(s): CHOL, HDL, LDLCALC, TRIG, CHOLHDL, LDLDIRECT in the last 72 hours. Thyroid Function Tests:  Recent Labs  02/09/16 0324  TSH 4.048   Anemia Panel:  Recent Labs  02/08/16 1844  VITAMINB12 485  FOLATE 30.9  FERRITIN 3*  TIBC 456*  IRON 8*  RETICCTPCT 1.8   Sepsis Labs: No results for input(s): PROCALCITON, LATICACIDVEN in the last 168 hours.  No results found for this or any previous visit (from the past 240 hour(s)).       Radiology Studies: Dg Chest 2 View  Result Date: 02/08/2016 CLINICAL DATA:  Chest pain, anemia, shortness of breath. EXAM: CHEST  2 VIEW COMPARISON:  Chest CT earlier this day. FINDINGS: The cardiomediastinal contours are normal. The lungs are clear. Pulmonary nodules on CT are not well seen radiographically. Pulmonary vasculature is normal. No consolidation, pleural effusion, or pneumothorax. No acute osseous abnormalities are seen. IMPRESSION: No active cardiopulmonary disease. Pulmonary nodules on CT are not well seen radiographically. Electronically Signed   By: Rubye OaksMelanie  Ehinger M.D.   On: 02/08/2016 21:05   Dg Chest 2 View  Result Date: 02/08/2016 CLINICAL DATA:  Dyspnea on exertion with chest pain x1 month EXAM: CHEST  2 VIEW COMPARISON:  None. FINDINGS: The heart size and mediastinal contours are  within normal limits. Both lungs are clear. The visualized skeletal structures are unremarkable. IMPRESSION: No active cardiopulmonary disease. Electronically Signed   By: Tollie Ethavid  Kwon M.D.   On: 02/08/2016 13:47   Ct Angio Chest Pe W And/or Wo Contrast  Result Date: 02/08/2016 CLINICAL DATA:  Chest pain and elevated D-dimer. Shortness of breath for 1 month. EXAM: CT ANGIOGRAPHY CHEST WITH CONTRAST TECHNIQUE: Multidetector CT imaging of the chest was performed using the standard protocol during bolus administration of intravenous contrast. Multiplanar CT image reconstructions and MIPs were obtained to evaluate the vascular anatomy. CONTRAST:  100 cc Isovue 370 IV COMPARISON:  Chest radiograph earlier this day. FINDINGS: Cardiovascular: Normal caliber isThere are no filling defects within the pulmonary arteries to suggest pulmonary embolus.  Normal caliber thoracic aorta without evidence of dissection. Normal heart size. No pericardial effusion. Mediastinum/Nodes: No mediastinal or hilar adenopathy. Small bilateral axillary nodes are not enlarged by size criteria. Mild thickening of the distal esophagus. Visualized thyroid gland is normal. Trachea is midline. Lungs/Pleura: Mild dependent atelectasis. In the perifissural left upper lobe there is a 8 x 7 mm nodule (8 mm mean diameter) on image 59 of series 5. There is a 4 mm right middle lobe nodule image 74. There are multiple bilateral perifissural and subpleural nodules measuring less than 5 mm. No pleural fluid. Upper Abdomen: Stomach is distended with ingested contents. No definite acute abnormality, paucity of intra-abdominal fat limits upper abdominal assessment. Musculoskeletal: There are no acute or suspicious osseous abnormalities. Review of the MIP images confirms the above findings. IMPRESSION: 1. No pulmonary embolus.  No acute intrathoracic abnormality. 2. **An incidental finding of potential clinical significance has been found. Multiple pulmonary  nodules, majority are perifissural, largest measuring 8 mm. Non-contrast chest CT at 3-6 months is recommended. If the nodules are stable at time of repeat CT, then future CT at 18-24 months (from today's scan) is considered optional for low-risk patients, but is recommended for high-risk patients. This recommendation follows the consensus statement: Guidelines for Management of Incidental Pulmonary Nodules Detected on CT Images: From the Fleischner Society 2017; Radiology 2017; 284:228-243.** Electronically Signed   By: Rubye OaksMelanie  Ehinger M.D.   On: 02/08/2016 20:59        Scheduled Meds: . sodium chloride flush  3 mL Intravenous Q12H   Continuous Infusions:   LOS: 0 days    Time spent: 35 minutes,.     Alba Coryegalado, Munirah Doerner A, MD [facility] Pager [phone]  If 7PM-7AM, please contact night-coverage [email or web address] Password [email or web address] 02/09/2016, 9:45 AM

## 2016-02-09 NOTE — Progress Notes (Addendum)
Spoke with pt via an interpreter to explain the plan of care as well as educate pt on new medications being given.

## 2016-02-09 NOTE — Progress Notes (Signed)
Received report from ED. Jonella Redditt Thacker, RN 

## 2016-02-10 DIAGNOSIS — R911 Solitary pulmonary nodule: Secondary | ICD-10-CM | POA: Diagnosis present

## 2016-02-10 DIAGNOSIS — R918 Other nonspecific abnormal finding of lung field: Secondary | ICD-10-CM

## 2016-02-10 DIAGNOSIS — K640 First degree hemorrhoids: Secondary | ICD-10-CM | POA: Diagnosis present

## 2016-02-10 DIAGNOSIS — E876 Hypokalemia: Secondary | ICD-10-CM | POA: Diagnosis present

## 2016-02-10 DIAGNOSIS — D649 Anemia, unspecified: Secondary | ICD-10-CM | POA: Diagnosis not present

## 2016-02-10 DIAGNOSIS — Z9889 Other specified postprocedural states: Secondary | ICD-10-CM | POA: Diagnosis not present

## 2016-02-10 DIAGNOSIS — R0902 Hypoxemia: Secondary | ICD-10-CM | POA: Diagnosis present

## 2016-02-10 DIAGNOSIS — R5383 Other fatigue: Secondary | ICD-10-CM | POA: Diagnosis present

## 2016-02-10 DIAGNOSIS — R109 Unspecified abdominal pain: Secondary | ICD-10-CM | POA: Diagnosis present

## 2016-02-10 DIAGNOSIS — E611 Iron deficiency: Secondary | ICD-10-CM | POA: Diagnosis not present

## 2016-02-10 DIAGNOSIS — Z8489 Family history of other specified conditions: Secondary | ICD-10-CM | POA: Diagnosis not present

## 2016-02-10 DIAGNOSIS — D6959 Other secondary thrombocytopenia: Secondary | ICD-10-CM | POA: Diagnosis present

## 2016-02-10 DIAGNOSIS — Z9049 Acquired absence of other specified parts of digestive tract: Secondary | ICD-10-CM | POA: Diagnosis not present

## 2016-02-10 DIAGNOSIS — D509 Iron deficiency anemia, unspecified: Secondary | ICD-10-CM | POA: Diagnosis present

## 2016-02-10 DIAGNOSIS — R791 Abnormal coagulation profile: Secondary | ICD-10-CM | POA: Diagnosis present

## 2016-02-10 DIAGNOSIS — J9811 Atelectasis: Secondary | ICD-10-CM | POA: Diagnosis present

## 2016-02-10 DIAGNOSIS — J189 Pneumonia, unspecified organism: Secondary | ICD-10-CM | POA: Diagnosis not present

## 2016-02-10 LAB — CBC
HEMATOCRIT: 31.3 % — AB (ref 39.0–52.0)
Hemoglobin: 9.6 g/dL — ABNORMAL LOW (ref 13.0–17.0)
MCH: 21.5 pg — ABNORMAL LOW (ref 26.0–34.0)
MCHC: 30.7 g/dL (ref 30.0–36.0)
MCV: 70.2 fL — ABNORMAL LOW (ref 78.0–100.0)
PLATELETS: 113 10*3/uL — AB (ref 150–400)
RBC: 4.46 MIL/uL (ref 4.22–5.81)
RDW: 18.5 % — AB (ref 11.5–15.5)
WBC: 7 10*3/uL (ref 4.0–10.5)

## 2016-02-10 LAB — HEPATIC FUNCTION PANEL
ALBUMIN: 3.5 g/dL (ref 3.5–5.0)
ALT: 26 U/L (ref 17–63)
AST: 27 U/L (ref 15–41)
Alkaline Phosphatase: 65 U/L (ref 38–126)
Bilirubin, Direct: 0.2 mg/dL (ref 0.1–0.5)
Indirect Bilirubin: 1.5 mg/dL — ABNORMAL HIGH (ref 0.3–0.9)
Total Bilirubin: 1.7 mg/dL — ABNORMAL HIGH (ref 0.3–1.2)
Total Protein: 6.4 g/dL — ABNORMAL LOW (ref 6.5–8.1)

## 2016-02-10 LAB — SEDIMENTATION RATE: SED RATE: 10 mm/h (ref 0–16)

## 2016-02-10 MED ORDER — SODIUM CHLORIDE 0.9 % IV SOLN
INTRAVENOUS | Status: DC
  Administered 2016-02-10 – 2016-02-11 (×3): via INTRAVENOUS
  Administered 2016-02-12: 1000 mL via INTRAVENOUS
  Administered 2016-02-12 – 2016-02-15 (×4): via INTRAVENOUS

## 2016-02-10 MED ORDER — PEG-KCL-NACL-NASULF-NA ASC-C 100 G PO SOLR: 1.0000 | Freq: Once | ORAL | Status: DC

## 2016-02-10 MED ORDER — PEG-KCL-NACL-NASULF-NA ASC-C 100 G PO SOLR
0.5000 | Freq: Once | ORAL | Status: AC
  Administered 2016-02-10: 100 g via ORAL
  Filled 2016-02-10: qty 1

## 2016-02-10 MED ORDER — SODIUM CHLORIDE 0.9 % IV SOLN
510.0000 mg | Freq: Once | INTRAVENOUS | Status: AC
  Administered 2016-02-10: 510 mg via INTRAVENOUS
  Filled 2016-02-10: qty 17

## 2016-02-10 MED ORDER — SODIUM CHLORIDE 0.9 % IV SOLN: INTRAVENOUS | Status: DC

## 2016-02-10 MED ORDER — PEG-KCL-NACL-NASULF-NA ASC-C 100 G PO SOLR
0.5000 | Freq: Once | ORAL | Status: AC
  Administered 2016-02-11: 100 g via ORAL

## 2016-02-10 MED ORDER — BISACODYL 5 MG PO TBEC
10.0000 mg | DELAYED_RELEASE_TABLET | Freq: Three times a day (TID) | ORAL | Status: AC
  Administered 2016-02-10 (×2): 10 mg via ORAL
  Filled 2016-02-10 (×2): qty 2

## 2016-02-10 NOTE — Progress Notes (Signed)
Daily Rounding Note  02/10/2016, 9:06 AM  LOS: 0 days   SUBJECTIVE:   Chief complaint: weakness which is better Pt has not had any black or red stools A friend of pts ins in room.  He is giving the back story on the pt.   Turns out his PCP is [facility], this is where the anemia and elevated d dimer was noted, they told pt to go to ED.     OBJECTIVE:         Vital signs in last 24 hours:    Temp:  [97.3 F (36.3 C)-98.5 F (36.9 C)] 97.3 F (36.3 C) (12/07 0504) Pulse Rate:  [62-70] 62 (12/07 0504) Resp:  [18] 18 (12/07 0504) BP: (94-107)/(51-56) 94/51 (12/07 0504) SpO2:  [96 %-100 %] 96 % (12/07 0504) Last BM Date: 02/09/16 Filed Weights   02/09/16 0435  Weight: 68.3 kg (150 lb 8 oz)   General: pleasant, NAD.  Not looking ill   Heart: RRR Chest: clear bil Abdomen: soft, NT, ND  Extremities: no CCE Neuro/Psych:  Oriented x 3  Intake/Output from previous day: 12/06 0701 - 12/07 0700 In: 700 [P.O.:700] Out: -   Intake/Output this shift: No intake/output data recorded.  Lab Results:  Recent Labs  02/08/16 1705 02/09/16 0324 02/10/16 0407  WBC 4.1 5.3 7.0  HGB 6.3* 8.7* 9.6*  HCT 21.6* 28.4* 31.3*  PLT 115* 117* 113*   BMET  Recent Labs  02/08/16 1705 02/09/16 0324  NA 137 137  K 3.6 3.7  CL 104 105  CO2 26 27  GLUCOSE 112* 111*  BUN 16 13  CREATININE 1.08 0.92  CALCIUM 9.0 8.9   LFT  Recent Labs  02/08/16 1705 02/10/16 0407  PROT 6.6 6.4*  ALBUMIN 3.6 3.5  AST 38 27  ALT 36 26  ALKPHOS 75 65  BILITOT 1.3* 1.7*  BILIDIR  --  0.2  IBILI  --  1.5*   PT/INR  Recent Labs  02/09/16 0324  LABPROT 14.2  INR 1.09   Hepatitis Panel No results for input(s): HEPBSAG, HCVAB, HEPAIGM, HEPBIGM in the last 72 hours.  Studies/Results: Dg Chest 2 View  Result Date: 02/08/2016 CLINICAL DATA:  Chest pain, anemia, shortness of breath. EXAM: CHEST  2 VIEW  COMPARISON:  Chest CT earlier this day. FINDINGS: The cardiomediastinal contours are normal. The lungs are clear. Pulmonary nodules on CT are not well seen radiographically. Pulmonary vasculature is normal. No consolidation, pleural effusion, or pneumothorax. No acute osseous abnormalities are seen. IMPRESSION: No active cardiopulmonary disease. Pulmonary nodules on CT are not well seen radiographically. Electronically Signed   By: Rubye OaksMelanie  Ehinger M.D.   On: 02/08/2016 21:05   Dg Chest 2 View  Result Date: 02/08/2016 CLINICAL DATA:  Dyspnea on exertion with chest pain x1 month EXAM: CHEST  2 VIEW COMPARISON:  None. FINDINGS: The heart size and mediastinal contours are within normal limits. Both lungs are clear. The visualized skeletal structures are unremarkable. IMPRESSION: No active cardiopulmonary disease. Electronically Signed   By: Tollie Ethavid  Kwon M.D.   On: 02/08/2016 13:47   Ct Angio Chest Pe W And/or Wo Contrast  Result Date: 02/08/2016 CLINICAL DATA:  Chest pain and elevated D-dimer. Shortness of breath for 1 month. EXAM: CT ANGIOGRAPHY CHEST WITH CONTRAST TECHNIQUE: Multidetector CT imaging of the chest was performed using the standard protocol during bolus administration of intravenous contrast. Multiplanar CT image reconstructions and MIPs  were obtained to evaluate the vascular anatomy. CONTRAST:  100 cc Isovue 370 IV COMPARISON:  Chest radiograph earlier this day. FINDINGS: Cardiovascular: Normal caliber isThere are no filling defects within the pulmonary arteries to suggest pulmonary embolus. Normal caliber thoracic aorta without evidence of dissection. Normal heart size. No pericardial effusion. Mediastinum/Nodes: No mediastinal or hilar adenopathy. Small bilateral axillary nodes are not enlarged by size criteria. Mild thickening of the distal esophagus. Visualized thyroid gland is normal. Trachea is midline. Lungs/Pleura: Mild dependent atelectasis. In the perifissural left upper lobe there is a  8 x 7 mm nodule (8 mm mean diameter) on image 59 of series 5. There is a 4 mm right middle lobe nodule image 74. There are multiple bilateral perifissural and subpleural nodules measuring less than 5 mm. No pleural fluid. Upper Abdomen: Stomach is distended with ingested contents. No definite acute abnormality, paucity of intra-abdominal fat limits upper abdominal assessment. Musculoskeletal: There are no acute or suspicious osseous abnormalities. Review of the MIP images confirms the above findings. IMPRESSION: 1. No pulmonary embolus.  No acute intrathoracic abnormality. 2. **An incidental finding of potential clinical significance has been found. Multiple pulmonary nodules, majority are perifissural, largest measuring 8 mm. Non-contrast chest CT at 3-6 months is recommended. If the nodules are stable at time of repeat CT, then future CT at 18-24 months (from today's scan) is considered optional for low-risk patients, but is recommended for high-risk patients. This recommendation follows the consensus statement: Guidelines for Management of Incidental Pulmonary Nodules Detected on CT Images: From the Fleischner Society 2017; Radiology 2017; 284:228-243.** Electronically Signed   By: Rubye OaksMelanie  Ehinger M.D.   On: 02/08/2016 20:59    ASSESMENT:   *  Symptomatic, microcytic anemia.  FOBT negative but reports intermittent looser and black stools.  Hgb improved post PRBC 2.  BID oral iron in place. Was using moderate, 400 mg daily, dose Ibuprofen for a couple of weeks, stopping this ~ 1 week ago, so ulcer disease is possible.  ? Hematologic disorder?   *  Thrombocytopenia.  T bili elevated, primarily indirect. This may be Gilbert's. Coags are normal.   *  Pulmonary nodules. ? Need for quantiferon gold test?   *  [name] pt.  Language barrier overcome using phone translator.     PLAN   *  Test for Hep B and C in AM.   *  Colon and EGD in AM.  These discussed with pt yesterday and he approved.    *  Ordered pharm consult for iron infusion.  Stopping oral iron for prep, can resume after procedures.    Jennye MoccasinSarah Gribbin  02/10/2016, 9:06 AM Pager: [phone]     Attending physician's note   I have taken an interval history, reviewed the chart and examined the patient. I agree with the Advanced Practitioner's note, impression and recommendations. IV Fe replacement and will proceed with colonoscopy and EGD in hospital tomorrow. Screen for Hep B, Venda Rodes.   Seaira Byus, MD Clementeen GrahamFACG [phone] Mon-Fri 8a-5p [phone] after 5p, weekends, holidays

## 2016-02-10 NOTE — Progress Notes (Signed)
PROGRESS NOTE    Leonard Fowler  XNT:[id number] DOB: 02-21-1976 DOA: 02/08/2016 PCP: No PCP Per Patient    Brief Narrative:  Leonard Fowler is a 40 y.o. male with no known past medical history who originates from [place] and has been here for almost one year, now presenting with approximately 1 months of progressive fatigue, exertional dyspnea, constant and vague chest discomfort, and low hemoglobin with elevated d-dimer on outpatient labs. Patient reports enjoying good health until the insidious development of exertional dyspnea and a vague, constant, mild chest discomfort approximately one month ago that has progressively worsened and been associated with general weakness and fatigue. Patient denies any fevers or chills during this interval and as not had any cough. He denies abdominal pain, nausea, vomiting, diarrhea, melena, or hematochezia. He does not take any medications regularly and denies any use of alcohol or illicit substances. He does not know of any family history of thalassemia or anemia. Patient was evaluated earlier today and in outpatient clinic with lab work that was revealing of a low hemoglobin and elevated d-dimer. He was directed to the ED for further evaluation of this.   Assessment & Plan:   Principal Problem:   Symptomatic anemia Active Problems:   Iron deficiency   Thrombocytopenia (HCC)   Chest pain   Incidental pulmonary nodule   Anemia  1-Anemia; Iron deficiency.  Patient denies, any skin rash, pruritus or any worm contact. No eosinophilia.  He report some abdominal pain, and BM soon after eating, black stool.  GI consulted, planning endoscopy/colonoscopy in am.  Started  Oral ferrous sulfate.  Hb increasing.   2-Thrombocytopenia;   Treat iron deficiency.  Follow trend.   3-Multiple  Pulmonary nodule.  Will ask pulmonary to evaluate./  Patient denies cough, night sweats.   4-Chest pain, dyspnea; related to anemia, improved.    5-Hyperbilirubinemia; Check for hepatitis.  Repeat labs in am.  Check for HIV.    DVT prophylaxis: scd Code Status: full code.  Family Communication: care discussed with patient.  Disposition Plan: GI evaluation.   Consultants:   GI      Procedures: none   Antimicrobials: none   Subjective: He is feeling better. Brother in law help with translation. Patient agree/ I offer use of translator line, he decline.  Report black stool for 2 weeks, abdominal pain for 1 month./    Objective: Vitals:   02/09/16 0435 02/09/16 1400 02/09/16 2035 02/10/16 0504  BP: 106/70 (!) 100/54 (!) 107/56 (!) 94/51  Pulse: 65 66 70 62  Resp: 18 18 18 18   Temp: 98.4 F (36.9 C) 98.5 F (36.9 C) 98.3 F (36.8 C) 97.3 F (36.3 C)  TempSrc: Oral Oral Oral Oral  SpO2: 99% 100% 99% 96%  Weight: 68.3 kg (150 lb 8 oz)     Height: 5' 6"  (1.676 m)       Intake/Output Summary (Last 24 hours) at 02/10/16 1117 Last data filed at 02/10/16 9675  Gross per 24 hour  Intake              460 ml  Output                0 ml  Net              460 ml   Filed Weights   02/09/16 0435  Weight: 68.3 kg (150 lb 8 oz)    Examination:  General exam: Appears calm and comfortable  Respiratory system: Clear to auscultation.  Respiratory effort normal. Cardiovascular system: S1 & S2 heard, RRR. No JVD, murmurs, rubs, gallops or clicks. No pedal edema. Gastrointestinal system: Abdomen is nondistended, soft and nontender. No organomegaly or masses felt. Normal bowel sounds heard. Central nervous system: Alert and oriented. No focal neurological deficits. Extremities: Symmetric 5 x 5 power. Skin: No rashes, lesions or ulcers Psychiatry: Judgement and insight appear normal. Mood & affect appropriate.     Data Reviewed: I have personally reviewed following labs and imaging studies  CBC:  Recent Labs Lab 02/08/16 1705 02/09/16 0324 02/10/16 0407  WBC 4.1 5.3 7.0  NEUTROABS  --  3.5  --   HGB 6.3*  8.7* 9.6*  HCT 21.6* 28.4* 31.3*  MCV 66.7* 70.5* 70.2*  PLT 115* 117* 458*   Basic Metabolic Panel:  Recent Labs Lab 02/08/16 1705 02/09/16 0324  NA 137 137  K 3.6 3.7  CL 104 105  CO2 26 27  GLUCOSE 112* 111*  BUN 16 13  CREATININE 1.08 0.92  CALCIUM 9.0 8.9   GFR: Estimated Creatinine Clearance: 95.4 mL/min (by C-G formula based on SCr of 0.92 mg/dL). Liver Function Tests:  Recent Labs Lab 02/08/16 1705 02/10/16 0407  AST 38 27  ALT 36 26  ALKPHOS 75 65  BILITOT 1.3* 1.7*  PROT 6.6 6.4*  ALBUMIN 3.6 3.5   No results for input(s): LIPASE, AMYLASE in the last 168 hours. No results for input(s): AMMONIA in the last 168 hours. Coagulation Profile:  Recent Labs Lab 02/09/16 0324  INR 1.09   Cardiac Enzymes:  Recent Labs Lab 02/08/16 1705  TROPONINI <0.03   BNP (last 3 results) No results for input(s): PROBNP in the last 8760 hours. HbA1C: No results for input(s): HGBA1C in the last 72 hours. CBG: No results for input(s): GLUCAP in the last 168 hours. Lipid Profile: No results for input(s): CHOL, HDL, LDLCALC, TRIG, CHOLHDL, LDLDIRECT in the last 72 hours. Thyroid Function Tests:  Recent Labs  02/09/16 0324  TSH 4.048   Anemia Panel:  Recent Labs  02/08/16 1844  VITAMINB12 485  FOLATE 30.9  FERRITIN 3*  TIBC 456*  IRON 8*  RETICCTPCT 1.8   Sepsis Labs: No results for input(s): PROCALCITON, LATICACIDVEN in the last 168 hours.  No results found for this or any previous visit (from the past 240 hour(s)).       Radiology Studies: Dg Chest 2 View  Result Date: 02/08/2016 CLINICAL DATA:  Chest pain, anemia, shortness of breath. EXAM: CHEST  2 VIEW COMPARISON:  Chest CT earlier this day. FINDINGS: The cardiomediastinal contours are normal. The lungs are clear. Pulmonary nodules on CT are not well seen radiographically. Pulmonary vasculature is normal. No consolidation, pleural effusion, or pneumothorax. No acute osseous abnormalities are  seen. IMPRESSION: No active cardiopulmonary disease. Pulmonary nodules on CT are not well seen radiographically. Electronically Signed   By: Jeb Levering M.D.   On: 02/08/2016 21:05   Dg Chest 2 View  Result Date: 02/08/2016 CLINICAL DATA:  Dyspnea on exertion with chest pain x1 month EXAM: CHEST  2 VIEW COMPARISON:  None. FINDINGS: The heart size and mediastinal contours are within normal limits. Both lungs are clear. The visualized skeletal structures are unremarkable. IMPRESSION: No active cardiopulmonary disease. Electronically Signed   By: Ashley Royalty M.D.   On: 02/08/2016 13:47   Ct Angio Chest Pe W And/or Wo Contrast  Result Date: 02/08/2016 CLINICAL DATA:  Chest pain and elevated D-dimer. Shortness of breath for 1 month. EXAM: CT  ANGIOGRAPHY CHEST WITH CONTRAST TECHNIQUE: Multidetector CT imaging of the chest was performed using the standard protocol during bolus administration of intravenous contrast. Multiplanar CT image reconstructions and MIPs were obtained to evaluate the vascular anatomy. CONTRAST:  100 cc Isovue 370 IV COMPARISON:  Chest radiograph earlier this day. FINDINGS: Cardiovascular: Normal caliber isThere are no filling defects within the pulmonary arteries to suggest pulmonary embolus. Normal caliber thoracic aorta without evidence of dissection. Normal heart size. No pericardial effusion. Mediastinum/Nodes: No mediastinal or hilar adenopathy. Small bilateral axillary nodes are not enlarged by size criteria. Mild thickening of the distal esophagus. Visualized thyroid gland is normal. Trachea is midline. Lungs/Pleura: Mild dependent atelectasis. In the perifissural left upper lobe there is a 8 x 7 mm nodule (8 mm mean diameter) on image 59 of series 5. There is a 4 mm right middle lobe nodule image 74. There are multiple bilateral perifissural and subpleural nodules measuring less than 5 mm. No pleural fluid. Upper Abdomen: Stomach is distended with ingested contents. No definite  acute abnormality, paucity of intra-abdominal fat limits upper abdominal assessment. Musculoskeletal: There are no acute or suspicious osseous abnormalities. Review of the MIP images confirms the above findings. IMPRESSION: 1. No pulmonary embolus.  No acute intrathoracic abnormality. 2. **An incidental finding of potential clinical significance has been found. Multiple pulmonary nodules, majority are perifissural, largest measuring 8 mm. Non-contrast chest CT at 3-6 months is recommended. If the nodules are stable at time of repeat CT, then future CT at 18-24 months (from today's scan) is considered optional for low-risk patients, but is recommended for high-risk patients. This recommendation follows the consensus statement: Guidelines for Management of Incidental Pulmonary Nodules Detected on CT Images: From the Fleischner Society 2017; Radiology 2017; 284:228-243.** Electronically Signed   By: Jeb Levering M.D.   On: 02/08/2016 20:59        Scheduled Meds: . bisacodyl  10 mg Oral Q8H  . ferumoxytol  510 mg Intravenous Once  . pantoprazole  40 mg Oral Daily  . peg 3350 powder  0.5 kit Oral Once   And  . [START ON 02/11/2016] peg 3350 powder  0.5 kit Oral Once  . sodium chloride flush  3 mL Intravenous Q12H   Continuous Infusions:   LOS: 0 days    Time spent: 35 minutes,.     Elmarie Shiley, MD [facility] Pager [phone]  If 7PM-7AM, please contact night-coverage [email or web address] Password [email or web address] 02/10/2016, 11:17 AM

## 2016-02-10 NOTE — Consult Note (Signed)
Name: Leonard Fowler MRN: [id number] DOB: 02/10/1976    ADMISSION DATE:  02/08/2016 CONSULTATION DATE:  02/10/16  REFERRING MD :  Dr. Tyrell Antonio   CHIEF COMPLAINT:  SOB   HISTORY OF PRESENT ILLNESS:  41 y/ M with PMH of appendectomy who presented to [facility] on 12/7 after being sent from his PCP to the ER for evaluation of anemia, elevated D-Dimer, SOB and chest pain.   The patient has lived in the [place] for 1 year.  He is married and has two children.  He works in a Engineer, manufacturing systems in the freezer.  He is a never smoker and never drinker.  He grew up in the [place], has a brother who is well.  Mother is still living but father was killed in an accident in 2001.  Denies HIV risk symptoms, reports negative HIV status to enter the [place].   The patient has noted increased fatigue over the past month.  He reports he has noted increased shortness of breath with activity.  He denies cough, fevers, sputum production, chills.  The patient reports he has experienced chest discomfort and abdominal pain that is not relieved by BM.  Denies hemoptysis.  He was seen in the ER on 12/5 and found to be afebrile with normal vital signs.  CXR was negative for acute process.  BMP within normal limits.  CBC with Hgb of 6.3, MCV 66.7, RDW 16.7, and platelets 115.  Troponin was negative.  Anemia panel showed normal B12, serum iron was very low at 8, ferritin of 3, elevated TIBC.  Stool negative for FOBT.  CTA of the Chest was evaluated for PE which was negative but showed incidental finding of multiple pulmonary nodules with the largest measuring 22m in the LLL.  He was given 2 units of PRBC's and admitted for further evaluation.    PCCM consulted for evaluation of pulmonary nodules.    PAST MEDICAL HISTORY :   has a past medical history of Medical history non-contributory.   has a past surgical history that includes Appendectomy (1990s).  Prior to Admission medications   Medication Sig Start Date End Date Taking? Authorizing  Provider  naproxen (NAPROSYN) 500 MG tablet Take 500 mg by mouth 2 (two) times daily with a meal.   Yes Historical Provider, MD    No Known Allergies  FAMILY HISTORY:  family history is not on file.  SOCIAL HISTORY:  reports that he has never smoked. He has never used smokeless tobacco. He reports that he does not drink alcohol or use drugs.  REVIEW OF SYSTEMS:  POSITIVES IN BOLD Constitutional: Negative for fever, chills, weight loss, malaise/fatigue and diaphoresis.  HENT: Negative for hearing loss, ear pain, nosebleeds, congestion, sore throat, neck pain, tinnitus and ear discharge.   Eyes: Negative for blurred vision, double vision, photophobia, pain, discharge and redness.  Respiratory: Negative for cough, hemoptysis, sputum production, shortness of breath, wheezing and stridor.   Cardiovascular: Negative for chest pain, palpitations, orthopnea, claudication, leg swelling and PND.  Gastrointestinal: Negative for heartburn, nausea, vomiting, abdominal pain, diarrhea, constipation, blood in stool and melena.  Genitourinary: Negative for dysuria, urgency, frequency, hematuria and flank pain.  Musculoskeletal: Negative for myalgias, back pain, joint pain and falls.  Skin: Negative for itching and rash.  Neurological: Negative for dizziness, tingling, tremors, sensory change, speech change, focal weakness, seizures, loss of consciousness, weakness and headaches.  Endo/Heme/Allergies: Negative for environmental allergies and polydipsia. Does not bruise/bleed easily.  SUBJECTIVE:   VITAL SIGNS: Temp:  [  97.3 F (36.3 C)-98.5 F (36.9 C)] 97.3 F (36.3 C) (12/07 0504) Pulse Rate:  [62-72] 72 (12/07 1347) Resp:  [17-18] 17 (12/07 1347) BP: (94-107)/(51-65) 104/65 (12/07 1347) SpO2:  [96 %-100 %] 99 % (12/07 1347)  PHYSICAL EXAMINATION: General:  Well developed young adult male in NAD  Neuro:  AAOx4, speech clear, MAE  HEENT:  MM pink/moist, no jvd Cardiovascular:  s1s2 rrr, no  m/r/g  Lungs:  Even/non-labored, lungs bilaterally clear  Abdomen:  Flat, soft, mild tenderness to palpation Musculoskeletal:  No acute deformities  Skin:  No rashes or lesions    Recent Labs Lab 02/08/16 1705 02/09/16 0324  NA 137 137  K 3.6 3.7  CL 104 105  CO2 26 27  BUN 16 13  CREATININE 1.08 0.92  GLUCOSE 112* 111*     Recent Labs Lab 02/08/16 1705 02/09/16 0324 02/10/16 0407  HGB 6.3* 8.7* 9.6*  HCT 21.6* 28.4* 31.3*  WBC 4.1 5.3 7.0  PLT 115* 117* 113*    Dg Chest 2 View  Result Date: 02/08/2016 CLINICAL DATA:  Chest pain, anemia, shortness of breath. EXAM: CHEST  2 VIEW COMPARISON:  Chest CT earlier this day. FINDINGS: The cardiomediastinal contours are normal. The lungs are clear. Pulmonary nodules on CT are not well seen radiographically. Pulmonary vasculature is normal. No consolidation, pleural effusion, or pneumothorax. No acute osseous abnormalities are seen. IMPRESSION: No active cardiopulmonary disease. Pulmonary nodules on CT are not well seen radiographically. Electronically Signed   By: Jeb Levering M.D.   On: 02/08/2016 21:05   Ct Angio Chest Pe W And/or Wo Contrast  Result Date: 02/08/2016 CLINICAL DATA:  Chest pain and elevated D-dimer. Shortness of breath for 1 month. EXAM: CT ANGIOGRAPHY CHEST WITH CONTRAST TECHNIQUE: Multidetector CT imaging of the chest was performed using the standard protocol during bolus administration of intravenous contrast. Multiplanar CT image reconstructions and MIPs were obtained to evaluate the vascular anatomy. CONTRAST:  100 cc Isovue 370 IV COMPARISON:  Chest radiograph earlier this day. FINDINGS: Cardiovascular: Normal caliber isThere are no filling defects within the pulmonary arteries to suggest pulmonary embolus. Normal caliber thoracic aorta without evidence of dissection. Normal heart size. No pericardial effusion. Mediastinum/Nodes: No mediastinal or hilar adenopathy. Small bilateral axillary nodes are not  enlarged by size criteria. Mild thickening of the distal esophagus. Visualized thyroid gland is normal. Trachea is midline. Lungs/Pleura: Mild dependent atelectasis. In the perifissural left upper lobe there is a 8 x 7 mm nodule (8 mm mean diameter) on image 59 of series 5. There is a 4 mm right middle lobe nodule image 74. There are multiple bilateral perifissural and subpleural nodules measuring less than 5 mm. No pleural fluid. Upper Abdomen: Stomach is distended with ingested contents. No definite acute abnormality, paucity of intra-abdominal fat limits upper abdominal assessment. Musculoskeletal: There are no acute or suspicious osseous abnormalities. Review of the MIP images confirms the above findings. IMPRESSION: 1. No pulmonary embolus.  No acute intrathoracic abnormality. 2. **An incidental finding of potential clinical significance has been found. Multiple pulmonary nodules, majority are perifissural, largest measuring 8 mm. Non-contrast chest CT at 3-6 months is recommended. If the nodules are stable at time of repeat CT, then future CT at 18-24 months (from today's scan) is considered optional for low-risk patients, but is recommended for high-risk patients. This recommendation follows the consensus statement: Guidelines for Management of Incidental Pulmonary Nodules Detected on CT Images: From the Fleischner Society 2017; Radiology 2017; 284:228-243.** Electronically Signed  By: Jeb Levering M.D.   On: 02/08/2016 20:59      SIGNIFICANT EVENTS  12/05  Admit with chest pain, exertional dyspnea, fatigue, low Hgb on outpatient labs  STUDIES:  CTA Chest 12/5 >> neg for PE, incidental finding of multiple pulmonary nodules, largest measuring 8 mm    ASSESSMENT / PLAN:  1.  Multiple Pulmonary Nodules - largest measuring 8 mm, differential dx includes fissural lymph nodes, fungal / bacterial infection, hamartoma / benign finding, malignancy.  Concern for possible fungal infection - Histo,  Psittaci  vs TB.  Must also rule out HIV. No rashes / lesions on exam. No lymphadenopathy on CTA, no night sweats, no cough or fever.    - pt not likely able to produce sputum - arrange for FOB for washings / potential biopsy >> set up for 12/11 at 1PM per Dr. Halford Chessman  - pt will need pulmonary follow up  - assess serum and urine histoplasma antigen - assess HIV  - Assess ESR, ACE  2. Chest Pain / Dyspnea - likely not related to pulmonary nodules, suspect secondary to anemia  - supportive care   3. Iron Deficiency Anemia - FOBT negative  - GI following - plan for EGD / colonoscopy in am  - transfuse for Hgb < 7%  4.  Thrombocytopenia  - per primary     Noe Gens, NP-C [facility] Pgr: [phone] or if no answer [phone] 02/10/2016, 1:52 PM  Attending Note:  I have examined patient, reviewed labs, studies and notes. I have discussed the case with B Ollis, and I agree with the data and plans as amended above. Small bilateral pulm nodules of varied sizes, most appear to be associated with the pleura, in a man with multiple risk factors for possible infectious process. He is an immigrant from [place] 1 yr ago. No certain TB exposure. 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 Difficult to correlate the nodules with findings of Fe deficiency anemia, thrombocytopenia, esophageal thickening although there may be a unifying diagnosis, especially if this is malignancy. I believe that most straightforward initial eval would be FOB for BAL and cx's. If unrevealing we could attempt to target the largest L nodule with ENB. Unable to schedule for tomorrow, so have scheduled for Monday. Could be done as an outpt if there was no other issue keeping him admitted. I explained the procedure and the rationale to the patient via his sister for [place] translation. I have spoken to Dr Halford Chessman and pt is scheduled for FOB on Monday 12/11.    Baltazar Apo, MD, PhD 02/10/2016, 4:59 PM [facility] [phone] or if no answer [phone]

## 2016-02-10 NOTE — Progress Notes (Signed)
MEDICATION RELATED CONSULT NOTE - INITIAL   Pharmacy Consult for IV iron Indication: iron deficient anemia  No Known Allergies  Patient Measurements: Height: 5\' 6"  (167.6 cm) Weight: 150 lb 8 oz (68.3 kg) IBW/kg (Calculated) : 63.8  Vital Signs: Temp: 97.3 F (36.3 C) (12/07 0504) Temp Source: Oral (12/07 0504) BP: 94/51 (12/07 0504) Pulse Rate: 62 (12/07 0504) Intake/Output from previous day: 12/06 0701 - 12/07 0700 In: 700 [P.O.:700] Out: -  Intake/Output from this shift: Total I/O In: 120 [P.O.:120] Out: -   Labs:  Recent Labs  02/08/16 1705 02/09/16 0324 02/10/16 0407  WBC 4.1 5.3 7.0  HGB 6.3* 8.7* 9.6*  HCT 21.6* 28.4* 31.3*  PLT 115* 117* 113*  APTT  --  31  --   CREATININE 1.08 0.92  --   ALBUMIN 3.6  --  3.5  PROT 6.6  --  6.4*  AST 38  --  27  ALT 36  --  26  ALKPHOS 75  --  65  BILITOT 1.3*  --  1.7*  BILIDIR  --   --  0.2  IBILI  --   --  1.5*   Estimated Creatinine Clearance: 95.4 mL/min (by C-G formula based on SCr of 0.92 mg/dL).   Microbiology: No results found for this or any previous visit (from the past 720 hour(s)).  Medical History: Past Medical History:  Diagnosis Date  . Medical history non-contributory     Medications:  See EMR  Assessment: Pharmacy consulted to dose IV iron in iron deficient anemia.  [age] yoM here with symptomatic microcytic anemia. Pt is now s/p 2 units of PRBCs and started on PO iron BID that is now being held for procedures. FOBT is negative but endorses previous intermittent black stools.      Component Value Date/Time   IRON 8 (L) 02/08/2016 1844   TIBC 456 (H) 02/08/2016 1844   FERRITIN 3 (L) 02/08/2016 1844   IRONPCTSAT 2 (L) 02/08/2016 1844    Plan:  Give Feraheme 510 mg IV x 1 dose Can consider re-dosing in 3- 7 days if desired. Monitor for hypersensitivity during infusion. Pharmacy sign off.   Thank you for allowing us to participate in this patients care. Signe Coltonya C Maurisio Ruddy,  PharmD Pager: [phone] 02/10/2016,11:16 AM

## 2016-02-11 ENCOUNTER — Telehealth: Payer: Self-pay | Admitting: Emergency Medicine

## 2016-02-11 ENCOUNTER — Encounter (HOSPITAL_COMMUNITY)
Admission: EM | Disposition: A | Discharge status: Home / Self Care | Payer: Self-pay | Source: Home / Self Care | Attending: Internal Medicine

## 2016-02-11 ENCOUNTER — Inpatient Hospital Stay (HOSPITAL_COMMUNITY): Payer: BLUE CROSS/BLUE SHIELD | Admitting: Anesthesiology

## 2016-02-11 ENCOUNTER — Encounter (HOSPITAL_COMMUNITY): Payer: Self-pay

## 2016-02-11 DIAGNOSIS — R911 Solitary pulmonary nodule: Secondary | ICD-10-CM

## 2016-02-11 HISTORY — PX: ESOPHAGOGASTRODUODENOSCOPY: SHX5428

## 2016-02-11 HISTORY — PX: COLONOSCOPY: SHX5424

## 2016-02-11 LAB — HEPATITIS C ANTIBODY: HCV Ab: <0.1 s/co ratio (ref 0.0–0.9)

## 2016-02-11 LAB — ANGIOTENSIN CONVERTING ENZYME: ANGIOTENSIN-CONVERTING ENZYME: 22 U/L (ref 14–82)

## 2016-02-11 LAB — COMPREHENSIVE METABOLIC PANEL
ALBUMIN: 3.1 g/dL — AB (ref 3.5–5.0)
ALT: 22 U/L (ref 17–63)
ANION GAP: 8 (ref 5–15)
AST: 25 U/L (ref 15–41)
Alkaline Phosphatase: 49 U/L (ref 38–126)
BILIRUBIN TOTAL: 1.5 mg/dL — AB (ref 0.3–1.2)
BUN: 7 mg/dL (ref 6–20)
CHLORIDE: 112 mmol/L — AB (ref 101–111)
CO2: 22 mmol/L (ref 22–32)
Calcium: 8.2 mg/dL — ABNORMAL LOW (ref 8.9–10.3)
Creatinine, Ser: 0.81 mg/dL (ref 0.61–1.24)
GFR calc Af Amer: >60 mL/min (ref >60)
GLUCOSE: 76 mg/dL (ref 65–99)
POTASSIUM: 3.9 mmol/L (ref 3.5–5.1)
Sodium: 142 mmol/L (ref 135–145)
TOTAL PROTEIN: 5.4 g/dL — AB (ref 6.5–8.1)

## 2016-02-11 LAB — CBC
HEMATOCRIT: 26.3 % — AB (ref 39.0–52.0)
Hemoglobin: 8 g/dL — ABNORMAL LOW (ref 13.0–17.0)
MCH: 21.6 pg — ABNORMAL LOW (ref 26.0–34.0)
MCHC: 30.4 g/dL (ref 30.0–36.0)
MCV: 71.1 fL — ABNORMAL LOW (ref 78.0–100.0)
PLATELETS: 99 10*3/uL — AB (ref 150–400)
RBC: 3.7 MIL/uL — ABNORMAL LOW (ref 4.22–5.81)
RDW: 19.3 % — AB (ref 11.5–15.5)
WBC: 5.1 10*3/uL (ref 4.0–10.5)

## 2016-02-11 LAB — HEPATITIS B CORE ANTIBODY, IGM: Hep B C IgM: NEGATIVE

## 2016-02-11 LAB — HIV ANTIBODY (ROUTINE TESTING W REFLEX)
HIV Screen 4th Generation wRfx: NONREACTIVE
HIV Screen 4th Generation wRfx: NONREACTIVE

## 2016-02-11 LAB — HEPATITIS B SURFACE ANTIBODY, QUANTITATIVE: HEPATITIS B-POST: 3.7 m[IU]/mL — AB

## 2016-02-11 LAB — HEPATITIS B SURFACE ANTIGEN: HEP B S AG: NEGATIVE

## 2016-02-11 SURGERY — EGD (ESOPHAGOGASTRODUODENOSCOPY)
Anesthesia: Monitor Anesthesia Care

## 2016-02-11 MED ORDER — PHENYLEPHRINE HCL 10 MG/ML IJ SOLN
INTRAMUSCULAR | Status: DC | PRN
  Administered 2016-02-11: 80 ug via INTRAVENOUS

## 2016-02-11 MED ORDER — FERROUS SULFATE 325 (65 FE) MG PO TABS
325.0000 mg | ORAL_TABLET | Freq: Two times a day (BID) | ORAL | Status: DC
  Administered 2016-02-11 – 2016-02-16 (×8): 325 mg via ORAL
  Filled 2016-02-11 (×8): qty 1

## 2016-02-11 MED ORDER — PROPOFOL 10 MG/ML IV BOLUS
INTRAVENOUS | Status: DC | PRN
  Administered 2016-02-11 (×5): 50 mg via INTRAVENOUS
  Administered 2016-02-11: 30 mg via INTRAVENOUS
  Administered 2016-02-11: 50 mg via INTRAVENOUS

## 2016-02-11 MED ORDER — LIDOCAINE HCL 1 % IJ SOLN
INTRAMUSCULAR | Status: DC | PRN
  Administered 2016-02-11: 20 mg via INTRADERMAL

## 2016-02-11 MED ORDER — LACTATED RINGERS IV SOLN
INTRAVENOUS | Status: DC | PRN
  Administered 2016-02-11: 09:00:00 via INTRAVENOUS

## 2016-02-11 NOTE — Care Management Note (Signed)
Case Management Note  Patient Details  Name: Leonard Fowler MRN: [id number] Date of Birth: 12/07/1975  Subjective/Objective:            Pt admitted with symptomatic anemia. Pt is from the [place] and has been [place] the [place]. Pt doesn't speak English. He speaks [name]. Lives with wife and 2 children. Independent with ADL's , no DME usage PTA.  [facility] per brother n law,  Leonard Fowler (Other)     [phone]        Action/Plan: Return to home when medically stable. CM to f/u with disposition needs.  Expected Discharge Date:    02/11/2016           Expected Discharge Plan:  Home/Self Care  In-House Referral:     Discharge planning Services  CM Consult  Post Acute Care Choice:    Choice offered to:     DME Arranged:    DME Agency:     HH Arranged:    HH Agency:     Status of Service:  In process, will continue to follow  If discussed at Long Length of Stay Meetings, dates discussed:    Additional Comments:  [name], Elaura Calix Hudson, RN 02/11/2016, 2:36 PM

## 2016-02-11 NOTE — H&P (View-Only) (Signed)
Daily Rounding Note  02/10/2016, 9:06 AM  LOS: 0 days   SUBJECTIVE:   Chief complaint: weakness which is better Pt has not had any black or red stools A friend of pts ins in room.  He is giving the back story on the pt.   Turns out his PCP is Dr Elij[facility] Milesibas [facility]iralis [facility], this is where the anemia and elevated d dimer was noted, they told pt to go to ED.     OBJECTIVE:         Vital signs in last 24 hours:    Temp:  [97.3 F (36.3 C)-98.5 F (36.9 C)] 97.3 F (36.3 C) (12/07 0504) Pulse Rate:  [62-70] 62 (12/07 0504) Resp:  [18] 18 (12/07 0504) BP: (94-107)/(51-56) 94/51 (12/07 0504) SpO2:  [96 %-100 %] 96 % (12/07 0504) Last BM Date: 02/09/16 Filed Weights   02/09/16 0435  Weight: 68.3 kg (150 lb 8 oz)   General: pleasant, NAD.  Not looking ill   Heart: RRR Chest: clear bil Abdomen: soft, NT, ND  Extremities: no CCE Neuro/Psych:  Oriented x 3  Intake/Output from previous day: 12/06 0701 - 12/07 0700 In: 700 [P.O.:700] Out: -   Intake/Output this shift: No intake/output data recorded.  Lab Results:  Recent Labs  02/08/16 1705 02/09/16 0324 02/10/16 0407  WBC 4.1 5.3 7.0  HGB 6.3* 8.7* 9.6*  HCT 21.6* 28.4* 31.3*  PLT 115* 117* 113*   BMET  Recent Labs  02/08/16 1705 02/09/16 0324  NA 137 137  K 3.6 3.7  CL 104 105  CO2 26 27  GLUCOSE 112* 111*  BUN 16 13  CREATININE 1.08 0.92  CALCIUM 9.0 8.9   LFT  Recent Labs  02/08/16 1705 02/10/16 0407  PROT 6.6 6.4*  ALBUMIN 3.6 3.5  AST 38 27  ALT 36 26  ALKPHOS 75 65  BILITOT 1.3* 1.7*  BILIDIR  --  0.2  IBILI  --  1.5*   PT/INR  Recent Labs  02/09/16 0324  LABPROT 14.2  INR 1.09   Hepatitis Panel No results for input(s): HEPBSAG, HCVAB, HEPAIGM, HEPBIGM in the last 72 hours.  Studies/Results: Dg Chest 2 View  Result Date: 02/08/2016 CLINICAL DATA:  Chest pain, anemia, shortness of breath. EXAM: CHEST  2 VIEW  COMPARISON:  Chest CT earlier this day. FINDINGS: The cardiomediastinal contours are normal. The lungs are clear. Pulmonary nodules on CT are not well seen radiographically. Pulmonary vasculature is normal. No consolidation, pleural effusion, or pneumothorax. No acute osseous abnormalities are seen. IMPRESSION: No active cardiopulmonary disease. Pulmonary nodules on CT are not well seen radiographically. Electronically Signed   By: Rubye OaksMelanie  Ehinger M.D.   On: 02/08/2016 21:05   Dg Chest 2 View  Result Date: 02/08/2016 CLINICAL DATA:  Dyspnea on exertion with chest pain x1 month EXAM: CHEST  2 VIEW COMPARISON:  None. FINDINGS: The heart size and mediastinal contours are within normal limits. Both lungs are clear. The visualized skeletal structures are unremarkable. IMPRESSION: No active cardiopulmonary disease. Electronically Signed   By: Tollie Ethavid  Kwon M.D.   On: 02/08/2016 13:47   Ct Angio Chest Pe W And/or Wo Contrast  Result Date: 02/08/2016 CLINICAL DATA:  Chest pain and elevated D-dimer. Shortness of breath for 1 month. EXAM: CT ANGIOGRAPHY CHEST WITH CONTRAST TECHNIQUE: Multidetector CT imaging of the chest was performed using the standard protocol during bolus administration of intravenous contrast. Multiplanar CT image reconstructions and MIPs  were obtained to evaluate the vascular anatomy. CONTRAST:  100 cc Isovue 370 IV COMPARISON:  Chest radiograph earlier this day. FINDINGS: Cardiovascular: Normal caliber isThere are no filling defects within the pulmonary arteries to suggest pulmonary embolus. Normal caliber thoracic aorta without evidence of dissection. Normal heart size. No pericardial effusion. Mediastinum/Nodes: No mediastinal or hilar adenopathy. Small bilateral axillary nodes are not enlarged by size criteria. Mild thickening of the distal esophagus. Visualized thyroid gland is normal. Trachea is midline. Lungs/Pleura: Mild dependent atelectasis. In the perifissural left upper lobe there is a  8 x 7 mm nodule (8 mm mean diameter) on image 59 of series 5. There is a 4 mm right middle lobe nodule image 74. There are multiple bilateral perifissural and subpleural nodules measuring less than 5 mm. No pleural fluid. Upper Abdomen: Stomach is distended with ingested contents. No definite acute abnormality, paucity of intra-abdominal fat limits upper abdominal assessment. Musculoskeletal: There are no acute or suspicious osseous abnormalities. Review of the MIP images confirms the above findings. IMPRESSION: 1. No pulmonary embolus.  No acute intrathoracic abnormality. 2. **An incidental finding of potential clinical significance has been found. Multiple pulmonary nodules, majority are perifissural, largest measuring 8 mm. Non-contrast chest CT at 3-6 months is recommended. If the nodules are stable at time of repeat CT, then future CT at 18-24 months (from today's scan) is considered optional for low-risk patients, but is recommended for high-risk patients. This recommendation follows the consensus statement: Guidelines for Management of Incidental Pulmonary Nodules Detected on CT Images: From the Fleischner Society 2017; Radiology 2017; 284:228-243.** Electronically Signed   By: Melanie  Ehinger M.D.   On: 02/08/2016 20:59    ASSESMENT:   *  Symptomatic, microcytic anemia.  FOBT negative but reports intermittent looser and black stools.  Hgb improved post PRBC 2.  BID oral iron in place. Was using moderate, 400 mg daily, dose Ibuprofen for a couple of weeks, stopping this ~ 1 week ago, so ulcer disease is possible.  ? Hematologic disorder?   *  Thrombocytopenia.  T bili elevated, primarily indirect. This may be Gilbert's. Coags are normal.   *  Pulmonary nodules. ? Need for quantiferon gold test?   *  French speaking pt.  Language barrier overcome using phone translator.     PLAN   *  Test for Hep B and C in AM.   *  Colon and EGD in AM.  These discussed with pt yesterday and he approved.    *  Ordered pharm consult for iron infusion.  Stopping oral iron for prep, can resume after procedures.    Sarah Gribbin  02/10/2016, 9:06 AM Pager: [phone]     Attending physician's note   I have taken an interval history, reviewed the chart and examined the patient. I agree with the Advanced Practitioner's note, impression and recommendations. IV Fe replacement and will proceed with colonoscopy and EGD in hospital tomorrow. Screen for Hep B, C.   Concettina Leth, MD FACG [phone] Mon-Fri 8a-5p [phone] after 5p, weekends, holidays 

## 2016-02-11 NOTE — Interval H&P Note (Signed)
History and Physical Interval Note:  02/11/2016 9:03 AM  Leonard Fowler  has presented today for surgery, with the diagnosis of iron def anemia, black stools  The various methods of treatment have been discussed with the patient and family. After consideration of risks, benefits and other options for treatment, the patient has consented to  Procedure(s): ESOPHAGOGASTRODUODENOSCOPY (EGD) (N/A) COLONOSCOPY (N/A) as a surgical intervention .  The patient's history has been reviewed, patient examined, no change in status, stable for surgery.  I have reviewed the patient's chart and labs.  Questions were answered to the patient's satisfaction.     Venita LickMalcolm T. Russella DarStark

## 2016-02-11 NOTE — Anesthesia Procedure Notes (Signed)
Procedure Name: MAC Date/Time: 02/11/2016 9:22 AM Performed by: Carmela RimaMARTINELLI, Carlisle F Pre-anesthesia Checklist: Timeout performed, Patient being monitored, Suction available, Emergency Drugs available and Patient identified Patient Re-evaluated:Patient Re-evaluated prior to inductionOxygen Delivery Method: Nasal cannula Placement Confirmation: positive ETCO2

## 2016-02-11 NOTE — Progress Notes (Signed)
PROGRESS NOTE    Leonard Fowler  EAV:[id number]:[id number] DOB: 12-Aug-1975 DOA: 02/08/2016 PCP: No PCP Per Patient    Brief Narrative:  Leonard Fowler is a 40 y.o. male with no known past medical history who originates from [place] and has been here for almost one year, now presenting with approximately 1 months of progressive fatigue, exertional dyspnea, constant and vague chest discomfort, and low hemoglobin with elevated d-dimer on outpatient labs. Patient reports enjoying good health until the insidious development of exertional dyspnea and a vague, constant, mild chest discomfort approximately one month ago that has progressively worsened and been associated with general weakness and fatigue. Patient denies any fevers or chills during this interval and as not had any cough. He denies abdominal pain, nausea, vomiting, diarrhea, melena, or hematochezia. He does not take any medications regularly and denies any use of alcohol or illicit substances. He does not know of any family history of thalassemia or anemia. Patient was evaluated earlier today and in outpatient clinic with lab work that was revealing of a low hemoglobin and elevated d-dimer. He was directed to the ED for further evaluation of this.   Assessment & Plan:   Principal Problem:   Symptomatic anemia Active Problems:   Iron deficiency   Thrombocytopenia (HCC)   Chest pain   Incidental pulmonary nodule   Anemia   Pulmonary nodules  1-Anemia; Iron deficiency.  Patient denies, any skin rash, pruritus or any worm contact. No eosinophilia.  He report some abdominal pain, and BM soon after eating, black stool.  GI consulted, planning endoscopy/colonoscopy in am.  Started  Oral ferrous sulfate.  Hb decreased to 8, will repeat labs in am.  Endoscopy, colonoscopy norma;   2-Thrombocytopenia;   Treat iron deficiency.  Follow trend.   3-Multiple  Pulmonary nodule.  Patient denies cough, night sweats.  Patient needs bronchoscopy.    Discussed options with patient. He would like to be discharge home tomorrow and follow up with Dr Delton CoombesByrum outpatient.   4-Chest pain, dyspnea; related to anemia, improved.   5-Hyperbilirubinemia; Hepatitis B antigen negative.  HIV negative.    DVT prophylaxis: scd Code Status: full code.  Family Communication: care discussed with patient.  Disposition Plan: GI evaluation.   Consultants:   GI      Procedures: none   Antimicrobials: none   Subjective: Feeling well, some nausea.  Brother in law helps with translation    Objective: Vitals:   02/11/16 0841 02/11/16 0949 02/11/16 0953 02/11/16 1003  BP: 122/73 (!) 84/39 (!) 101/51 108/68  Pulse: 60 65 62 (!) 56  Resp: 14 16 15  (!) 25  Temp: 97.7 F (36.5 C) 97.6 F (36.4 C)    TempSrc: Oral Oral    SpO2: 100% 100% 100% 100%  Weight: 68 kg (150 lb)     Height: 5\' 6"  (1.676 m)       Intake/Output Summary (Last 24 hours) at 02/11/16 1512 Last data filed at 02/11/16 1500  Gross per 24 hour  Intake             3600 ml  Output                1 ml  Net             3599 ml   Filed Weights   02/09/16 0435 02/11/16 0841  Weight: 68.3 kg (150 lb 8 oz) 68 kg (150 lb)    Examination:  General exam: Appears calm and comfortable  Respiratory system: Clear to auscultation. Respiratory effort normal. Cardiovascular system: S1 & S2 heard, RRR. No JVD, murmurs, rubs, gallops or clicks. No pedal edema. Gastrointestinal system: Abdomen is nondistended, soft and nontender. No organomegaly or masses felt. Normal bowel sounds heard. Central nervous system: Alert and oriented. No focal neurological deficits. Extremities: Symmetric 5 x 5 power. Skin: No rashes, lesions or ulcers Psychiatry: Judgement and insight appear normal. Mood & affect appropriate.     Data Reviewed: I have personally reviewed following labs and imaging studies  CBC:  Recent Labs Lab 02/08/16 1705 02/09/16 0324 02/10/16 0407 02/11/16 0517  WBC  4.1 5.3 7.0 5.1  NEUTROABS  --  3.5  --   --   HGB 6.3* 8.7* 9.6* 8.0*  HCT 21.6* 28.4* 31.3* 26.3*  MCV 66.7* 70.5* 70.2* 71.1*  PLT 115* 117* 113* 99*   Basic Metabolic Panel:  Recent Labs Lab 02/08/16 1705 02/09/16 0324 02/11/16 0517  NA 137 137 142  K 3.6 3.7 3.9  CL 104 105 112*  CO2 26 27 22   GLUCOSE 112* 111* 76  BUN 16 13 7   CREATININE 1.08 0.92 0.81  CALCIUM 9.0 8.9 8.2*   GFR: Estimated Creatinine Clearance: 108.3 mL/min (by C-G formula based on SCr of 0.81 mg/dL). Liver Function Tests:  Recent Labs Lab 02/08/16 1705 02/10/16 0407 02/11/16 0517  AST 38 27 25  ALT 36 26 22  ALKPHOS 75 65 49  BILITOT 1.3* 1.7* 1.5*  PROT 6.6 6.4* 5.4*  ALBUMIN 3.6 3.5 3.1*   No results for input(s): LIPASE, AMYLASE in the last 168 hours. No results for input(s): AMMONIA in the last 168 hours. Coagulation Profile:  Recent Labs Lab 02/09/16 0324  INR 1.09   Cardiac Enzymes:  Recent Labs Lab 02/08/16 1705  TROPONINI <0.03   BNP (last 3 results) No results for input(s): PROBNP in the last 8760 hours. HbA1C: No results for input(s): HGBA1C in the last 72 hours. CBG: No results for input(s): GLUCAP in the last 168 hours. Lipid Profile: No results for input(s): CHOL, HDL, LDLCALC, TRIG, CHOLHDL, LDLDIRECT in the last 72 hours. Thyroid Function Tests:  Recent Labs  02/09/16 0324  TSH 4.048   Anemia Panel:  Recent Labs  02/08/16 1844  VITAMINB12 485  FOLATE 30.9  FERRITIN 3*  TIBC 456*  IRON 8*  RETICCTPCT 1.8   Sepsis Labs: No results for input(s): PROCALCITON, LATICACIDVEN in the last 168 hours.  No results found for this or any previous visit (from the past 240 hour(s)).       Radiology Studies: No results found.      Scheduled Meds: . ferrous sulfate  325 mg Oral BID WC  . pantoprazole  40 mg Oral Daily  . sodium chloride flush  3 mL Intravenous Q12H   Continuous Infusions: . sodium chloride 100 mL/hr at 02/11/16 0153      LOS: 1 day    Time spent: 35 minutes,.     Alba Coryegalado, Belkys A, MD [facility] Pager [phone]  If 7PM-7AM, please contact night-coverage [email or web address] Password [email or web address] 02/11/2016, 3:12 PM

## 2016-02-11 NOTE — Anesthesia Postprocedure Evaluation (Signed)
Anesthesia Post Note  Patient: Leonard Fowler  Procedure(s) Performed: Procedure(s) (LRB): ESOPHAGOGASTRODUODENOSCOPY (EGD) (N/A) COLONOSCOPY (N/A)  Patient location during evaluation: Endoscopy Anesthesia Type: MAC Level of consciousness: awake and alert Pain management: pain level controlled Vital Signs Assessment: post-procedure vital signs reviewed and stable Respiratory status: spontaneous breathing, nonlabored ventilation, respiratory function stable and patient connected to nasal cannula oxygen Cardiovascular status: stable and blood pressure returned to baseline Anesthetic complications: no    Last Vitals:  Vitals:   02/11/16 1003 02/11/16 1528  BP: 108/68 106/60  Pulse: (!) 56 78  Resp: (!) 25 18  Temp:  36.4 C    Last Pain:  Vitals:   02/11/16 1528  TempSrc: Oral  PainSc:                  Cecile HearingStephen Edward Turk

## 2016-02-11 NOTE — Op Note (Signed)
[facility] Patient Name: Leonard Fowler Procedure Date : 02/11/2016 MRN: [id number] Attending MD: [name] , MD Date of Birth: April 11, 1975 CSN: [id number] Age: 40 Admit Type: Inpatient Procedure:                Upper GI endoscopy Indications:              Iron deficiency anemia Providers:                Venita Lick. Russella Dar, MD, Dwain Sarna, RN, Harrington Challenger, Technician Referring MD:             [facility] Medicines:                Monitored Anesthesia Care Complications:            No immediate complications. Estimated Blood Loss:     Estimated blood loss was minimal. Procedure:                Pre-Anesthesia Assessment:                           - Prior to the procedure, a History and Physical                            was performed, and patient medications and                            allergies were reviewed. The patient's tolerance of                            previous anesthesia was also reviewed. The risks                            and benefits of the procedure and the sedation                            options and risks were discussed with the patient.                            All questions were answered, and informed consent                            was obtained. Prior Anticoagulants: The patient has                            taken no previous anticoagulant or antiplatelet                            agents. ASA Grade Assessment: II - A patient with                            mild systemic disease. After reviewing the risks  and benefits, the patient was deemed in                            satisfactory condition to undergo the procedure.                           After obtaining informed consent, the endoscope was                            passed under direct vision. Throughout the                            procedure, the patient's blood pressure, pulse, and   oxygen saturations were monitored continuously. The                            [id number] ([id number]) scope was introduced through the                            mouth, and advanced to the second part of duodenum.                            The upper GI endoscopy was accomplished without                            difficulty. The patient tolerated the procedure                            well. Scope In: Scope Out: Findings:      The examined esophagus was normal.      The entire examined stomach was normal.      The duodenal bulb and second portion of the duodenum were normal.       Biopsies for histology were taken with a cold forceps for evaluation of       celiac disease. Impression:               - Normal esophagus.                           - Normal stomach.                           - Normal duodenal bulb and second portion of the                            duodenum. Biopsied. Moderate Sedation:      none/MAC Recommendation:           - Return patient to hospital ward for ongoing care.                           - Resume previous diet.                           - Continue present medications.                           -  Await pathology results.                           - GI signing off. GI follow up as needed. Procedure Code(s):        --- Professional ---                           [phone], Esophagogastroduodenoscopy, flexible,                            transoral; with biopsy, single or multiple Diagnosis Code(s):        --- Professional ---                           D50.9, Iron deficiency anemia, unspecified CPT copyright 2016 American Medical Association. All rights reserved. The codes documented in this report are preliminary and upon coder review may  be revised to meet current compliance requirements. Meryl DareMalcolm T Dawnelle Warman, MD 02/11/2016 9:45:42 AM This report has been signed electronically. Number of Addenda: 0

## 2016-02-11 NOTE — Transfer of Care (Signed)
Immediate Anesthesia Transfer of Care Note  Patient: Leonard Fowler  Procedure(s) Performed: Procedure(s): ESOPHAGOGASTRODUODENOSCOPY (EGD) (N/A) COLONOSCOPY (N/A)  Patient Location: Endoscopy Unit  Anesthesia Type:MAC  Level of Consciousness: awake, alert  and oriented  Airway & Oxygen Therapy: Patient Spontanous Breathing and Patient connected to nasal cannula oxygen  Post-op Assessment: Report given to RN, Post -op Vital signs reviewed and stable and Patient moving all extremities X 4  Post vital signs: Reviewed and stable  Last Vitals:  Vitals:   02/11/16 0455 02/11/16 0841  BP: (!) 95/57 122/73  Pulse: (!) 59 60  Resp: 17 14  Temp: 36.7 C 36.5 C    Last Pain:  Vitals:   02/11/16 0841  TempSrc: Oral  PainSc:          Complications: No apparent anesthesia complications

## 2016-02-11 NOTE — Progress Notes (Addendum)
Name: Leonard Fowler MRN: [id number] DOB: 1975-11-08    ADMISSION DATE:  02/08/2016 CONSULTATION DATE:  02/10/16  REFERRING MD :  Dr. Tyrell Fowler   CHIEF COMPLAINT:  SOB   HISTORY OF PRESENT ILLNESS:  26 y/ M with PMH of appendectomy who presented to [facility] on 12/7 after being sent from his PCP to the ER for evaluation of anemia, elevated D-Dimer, SOB and chest pain.   The patient has lived in the [place] for 1 year.  He is married and has two children.  He works in a Engineer, manufacturing systems in the freezer.  He is a never smoker and never drinker.  He grew up in the [place], has a brother who is well.  Mother is still living but father was killed in an accident in 2001.  Denies HIV risk symptoms, reports negative HIV status to enter the [place].   The patient has noted increased fatigue over the past month.  He reports he has noted increased shortness of breath with activity.  He denies cough, fevers, sputum production, chills.  The patient reports he has experienced chest discomfort and abdominal pain that is not relieved by BM.  Denies hemoptysis.  He was seen in the ER on 12/5 and found to be afebrile with normal vital signs.  CXR was negative for acute process.  BMP within normal limits.  CBC with Hgb of 6.3, MCV 66.7, RDW 16.7, and platelets 115.  Troponin was negative.  Anemia panel showed normal B12, serum iron was very low at 8, ferritin of 3, elevated TIBC.  Stool negative for FOBT.  CTA of the Chest was evaluated for PE which was negative but showed incidental finding of multiple pulmonary nodules with the largest measuring 76m in the LLL.  He was given 2 units of PRBC's and admitted for further evaluation.    PCCM consulted for evaluation of pulmonary nodules.    SUBJECTIVE:   VITAL SIGNS: Temp:  [97.6 F (36.4 C)-98.1 F (36.7 C)] 97.6 F (36.4 C) (12/08 0949) Pulse Rate:  [56-72] 56 (12/08 1003) Resp:  [14-25] 25 (12/08 1003) BP: (84-122)/(39-73) 108/68 (12/08 1003) SpO2:  [99 %-100 %] 100 % (12/08  1003) Weight:  [150 lb (68 kg)] 150 lb (68 kg) (12/08 0841)  PHYSICAL EXAMINATION: General:  Well developed young adult male in NAD, supine in bed on RA Neuro:  AAOx4, speech clear, MAE  HEENT:  MM pink/moist, no jvd Cardiovascular:  s1s2 rrr, no m/r/g  Lungs:  Even/non-labored, lungs bilaterally clear  Abdomen:  Flat, soft, mild tenderness to palpation Musculoskeletal:  No acute deformities  Skin:  No rashes or lesions    Recent Labs Lab 02/08/16 1705 02/09/16 0324 02/11/16 0517  NA 137 137 142  K 3.6 3.7 3.9  CL 104 105 112*  CO2 26 27 22   BUN 16 13 7   CREATININE 1.08 0.92 0.81  GLUCOSE 112* 111* 76     Recent Labs Lab 02/09/16 0324 02/10/16 0407 02/11/16 0517  HGB 8.7* 9.6* 8.0*  HCT 28.4* 31.3* 26.3*  WBC 5.3 7.0 5.1  PLT 117* 113* 99*    No results found.    SIGNIFICANT EVENTS  12/05  Admit with chest pain, exertional dyspnea, fatigue, low Hgb on outpatient labs  STUDIES:  CTA Chest 12/5 >> neg for PE, incidental finding of multiple pulmonary nodules, largest measuring 8 mm    ASSESSMENT / PLAN:  1.  Multiple Pulmonary Nodules - largest measuring 8 mm, differential dx includes fissural lymph  nodes, fungal / bacterial infection, hamartoma / benign finding, malignancy.  Concern for possible fungal infection due to multiple risk factors -immigrant from [place] 1 yr ago. 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 Difficult to correlate the nodules with findings of Fe deficiency anemia, thrombocytopenia, esophageal thickening although there may be a unifying diagnosis, especially if this is malignancy. Histo, Psittaci  vs TB.  Negative HIV. No rashes / lesions on exam. No lymphadenopathy on CTA, no night sweats, no cough or fever.    - pt not likely able to produce sputum - arrange for FOB for washings / potential biopsy >> set up for 12/11 at 1PM per Dr. Halford Fowler  - pt will need pulmonary  follow up  - assess serum and urine histoplasma antigen - HIV negative  - Assess ESR>> 10 mm/hr - Assess  ACE>> Pending  2. Chest Pain / Dyspnea - likely not related to pulmonary nodules, suspect secondary to anemia  - supportive care   3. Iron Deficiency Anemia - FOBT negative  - GI following - plan for EGD / colonoscopy 02/11/2016  - transfuse for Hgb < 7%  4.  Thrombocytopenia  - per primary   Of note, patient's primary language is [place].  Leonard Fowler, AGACNP-BC [facility] Pager # [phone] 02/11/2016, 10:56 AM  Attending Note:  I have examined patient, reviewed labs, studies and notes. I have discussed the case with Leonard Fowler, and I agree with the data and plans as amended above. Pt with small b nodules, ? Etiology. Also w newly dx anemia > CSY and EGD were both reassuring today. I believe he will need FOB to allow [place] to obtain cx data. Can be done as an outpt if pt desires. Set him up to see Leonard Fowler in office. If he does stay in the hospital we have him scheduled for this on 12/11 as above.   Call if we can help through the weekend.   Leonard Apo, MD, PhD 02/11/2016, 3:37 PM [facility] [phone] or if no answer [phone]

## 2016-02-11 NOTE — Op Note (Signed)
[facility] Patient Name: Leonard SersJohn Fowler Procedure Date : 02/11/2016 MRN: [id number] Attending MD: Meryl DareMalcolm T Adriene Knipfer , MD Date of Birth: 1976-02-27 CSN: [id number] Age: [age] Admit Type: Inpatient Procedure:                Colonoscopy Indications:              Iron deficiency anemia Providers:                Venita LickMalcolm T. Russella DarStark, MD, Dwain SarnaPatricia Ford, RN, Harrington ChallengerHope                            Parker, Technician Referring MD:             [facility] Medicines:                Monitored Anesthesia Care Complications:            No immediate complications. Estimated blood loss:                            None. Estimated Blood Loss:     Estimated blood loss: none. Procedure:                Pre-Anesthesia Assessment:                           - Prior to the procedure, a History and Physical                            was performed, and patient medications and                            allergies were reviewed. The patient's tolerance of                            previous anesthesia was also reviewed. The risks                            and benefits of the procedure and the sedation                            options and risks were discussed with the patient.                            All questions were answered, and informed consent                            was obtained. Prior Anticoagulants: The patient has                            taken no previous anticoagulant or antiplatelet                            agents. ASA Grade Assessment: II - A patient with                            mild  systemic disease. After reviewing the risks                            and benefits, the patient was deemed in                            satisfactory condition to undergo the procedure.                           After obtaining informed consent, the colonoscope                            was passed under direct vision. Throughout the                            procedure, the patient's blood  pressure, pulse, and                            oxygen saturations were monitored continuously. The                            [id number] ([id number]) scope was introduced through                            the anus and advanced to the the terminal ileum.                            The ileocecal valve, appendiceal orifice, and                            rectum were photographed. The quality of the bowel                            preparation was good. The colonoscopy was performed                            without difficulty. The patient tolerated the                            procedure well. Scope In: 9:19:06 AM Scope Out: 9:30:51 AM Scope Withdrawal Time: 0 hours 9 minutes 38 seconds  Total Procedure Duration: 0 hours 11 minutes 45 seconds  Findings:      The perianal and digital rectal examinations were normal.      Internal hemorrhoids were found during retroflexion. The hemorrhoids       were small and Grade I (internal hemorrhoids that do not prolapse).      The terminal ileum appeared normal.      The exam was otherwise without abnormality on direct and retroflexion       views. Impression:               - The examination was otherwise normal on direct                            and retroflexion views.                           -  Internal hemorrhoids.                           - No specimens collected. Moderate Sedation:      none/MAC Recommendation:           - Repeat colonoscopy in 10 years for screening                            purposes.                           - Patient has a contact number available for                            emergencies. The signs and symptoms of potential                            delayed complications were discussed with the                            patient. Return to normal activities tomorrow.                            Written discharge instructions were provided to the                            patient.                           - Resume  previous diet.                           - Continue present medications. Procedure Code(s):        --- Professional ---                           [id number], Colonoscopy, flexible; diagnostic, including                            collection of specimen(s) by brushing or washing,                            when performed (separate procedure) Diagnosis Code(s):        --- Professional ---                           K64.0, First degree hemorrhoids                           D50.9, Iron deficiency anemia, unspecified CPT copyright 2016 American Medical Association. All rights reserved. The codes documented in this report are preliminary and upon coder review may  be revised to meet current compliance requirements. [name], MD 02/11/2016 9:42:49 AM This report has been signed electronically. Number of Addenda: 0

## 2016-02-11 NOTE — Anesthesia Preprocedure Evaluation (Signed)
Anesthesia Evaluation  Patient identified by MRN, date of birth, ID band Patient awake    Reviewed: Allergy & Precautions, NPO status , Patient's Chart, lab work & pertinent test results  Airway Mallampati: II  TM Distance: >3 FB Neck ROM: Full    Dental  (+) Teeth Intact, Dental Advisory Given   Pulmonary  Multiple pulmonary nodules   Pulmonary exam normal breath sounds clear to auscultation       Cardiovascular Exercise Tolerance: Good negative cardio ROS Normal cardiovascular exam Rhythm:Regular Rate:Normal     Neuro/Psych negative neurological ROS  negative psych ROS   GI/Hepatic negative GI ROS, Neg liver ROS,   Endo/Other  negative endocrine ROS  Renal/GU negative Renal ROS     Musculoskeletal negative musculoskeletal ROS (+)   Abdominal   Peds  Hematology  (+) Blood dyscrasia, anemia , Thrombocytopenia  Plt 99k   Anesthesia Other Findings Day of surgery medications reviewed with the patient.  Reproductive/Obstetrics                             Anesthesia Physical Anesthesia Plan  ASA: II  Anesthesia Plan: MAC   Post-op Pain Management:    Induction: Intravenous  Airway Management Planned: Nasal Cannula  Additional Equipment:   Intra-op Plan:   Post-operative Plan:   Informed Consent: I have reviewed the patients History and Physical, chart, labs and discussed the procedure including the risks, benefits and alternatives for the proposed anesthesia with the patient or authorized representative who has indicated his/her understanding and acceptance.   Dental advisory given  Plan Discussed with: CRNA and Anesthesiologist  Anesthesia Plan Comments: (Discussed risks/benefits/alternatives to MAC sedation including need for ventilatory support, hypotension, need for conversion to general anesthesia.  All patient questions answered.  Patient/guardian wishes to proceed.)         Anesthesia Quick Evaluation

## 2016-02-12 LAB — MISC LABCORP TEST (SEND OUT): Labcorp test code: 138635

## 2016-02-12 LAB — HEMOGLOBIN AND HEMATOCRIT, BLOOD
HEMATOCRIT: 28.9 % — AB (ref 39.0–52.0)
HEMOGLOBIN: 8.5 g/dL — AB (ref 13.0–17.0)

## 2016-02-12 MED ORDER — ACETAMINOPHEN 325 MG PO TABS: 650.0000 mg | ORAL_TABLET | Freq: Four times a day (QID) | ORAL | 0 refills | Status: DC | PRN

## 2016-02-12 MED ORDER — FERROUS SULFATE 325 (65 FE) MG PO TABS: 325.0000 mg | ORAL_TABLET | Freq: Three times a day (TID) | ORAL | 3 refills | Status: AC

## 2016-02-12 MED ORDER — PANTOPRAZOLE SODIUM 40 MG PO TBEC: 40.0000 mg | DELAYED_RELEASE_TABLET | Freq: Every day | ORAL | 0 refills | Status: DC

## 2016-02-12 NOTE — Progress Notes (Signed)
PROGRESS NOTE    Leonard PopJohn K Apel  NFA:[id number] DOB: 12/07/1975 DOA: 02/08/2016 PCP: No PCP Per Patient    Brief Narrative:  Leonard Fowler is a 40 y.o. male with no known past medical history who originates from [place] and has been here for almost one year, now presenting with approximately 1 months of progressive fatigue, exertional dyspnea, constant and vague chest discomfort, and low hemoglobin with elevated d-dimer on outpatient labs. Patient reports enjoying good health until the insidious development of exertional dyspnea and a vague, constant, mild chest discomfort approximately one month ago that has progressively worsened and been associated with general weakness and fatigue. Patient denies any fevers or chills during this interval and as not had any cough. He denies abdominal pain, nausea, vomiting, diarrhea, melena, or hematochezia. He does not take any medications regularly and denies any use of alcohol or illicit substances. He does not know of any family history of thalassemia or anemia. Patient was evaluated earlier today and in outpatient clinic with lab work that was revealing of a low hemoglobin and elevated d-dimer. He was directed to the ED for further evaluation of this.   Assessment & Plan:   Principal Problem:   Symptomatic anemia Active Problems:   Iron deficiency   Thrombocytopenia (HCC)   Chest pain   Incidental pulmonary nodule   Anemia   Pulmonary nodules  1-Anemia; Iron deficiency.  Patient denies, any skin rash, pruritus or any worm contact. No eosinophilia.  He report some abdominal pain, and BM soon after eating, black stool.  GI consulted, planning endoscopy/colonoscopy in am.  Started  Oral ferrous sulfate.  Hb at 8.5, will continue with iron supplement.  Endoscopy, colonoscopy normal;   2-Thrombocytopenia;   Treat iron deficiency.  Follow trend.   3-Multiple  Pulmonary nodule.  Patient denies cough, night sweats.  Patient needs bronchoscopy,  planned for Monday. .  Patient will benefit from inpatient bronchoscopy, he now wants to stay for procedure.   4-Chest pain, dyspnea; related to anemia, improved.   5-Hyperbilirubinemia; Hepatitis B antigen negative.  HIV negative.    DVT prophylaxis: scd Code Status: full code.  Family Communication: care discussed with patient.  Disposition Plan: bronchoscopy on Monday   Consultants:   GI      Procedures: none   Antimicrobials: none   Subjective: Agree to stay for bronchoscopy on Monday  Brother in law helps with translation    Objective: Vitals:   02/11/16 1003 02/11/16 1528 02/11/16 2038 02/12/16 0445  BP: 108/68 106/60 113/65 (!) 95/49  Pulse: (!) 56 78 71 (!) 59  Resp: (!) 25 18 18 19   Temp:  97.5 F (36.4 C) 98.2 F (36.8 C) 98.4 F (36.9 C)  TempSrc:  Oral  Oral  SpO2: 100% 100% 100% 99%  Weight:      Height:        Intake/Output Summary (Last 24 hours) at 02/12/16 1303 Last data filed at 02/12/16 0600  Gross per 24 hour  Intake          2533.33 ml  Output                0 ml  Net          2533.33 ml   Filed Weights   02/09/16 0435 02/11/16 0841  Weight: 68.3 kg (150 lb 8 oz) 68 kg (150 lb)    Examination:  General exam: Appears calm and comfortable  Respiratory system: Clear to auscultation. Respiratory effort normal. Cardiovascular  system: S1 & S2 heard, RRR. No JVD, murmurs, rubs, gallops or clicks. No pedal edema. Gastrointestinal system: Abdomen is nondistended, soft and nontender. No organomegaly or masses felt. Normal bowel sounds heard. Central nervous system: Alert and oriented. No focal neurological deficits. Extremities: Symmetric 5 x 5 power. Skin: No rashes, lesions or ulcers Psychiatry: Judgement and insight appear normal. Mood & affect appropriate.     Data Reviewed: I have personally reviewed following labs and imaging studies  CBC:  Recent Labs Lab 02/08/16 1705 02/09/16 0324 02/10/16 0407 02/11/16 0517  02/12/16 0717  WBC 4.1 5.3 7.0 5.1  --   NEUTROABS  --  3.5  --   --   --   HGB 6.3* 8.7* 9.6* 8.0* 8.5*  HCT 21.6* 28.4* 31.3* 26.3* 28.9*  MCV 66.7* 70.5* 70.2* 71.1*  --   PLT 115* 117* 113* 99*  --    Basic Metabolic Panel:  Recent Labs Lab 02/08/16 1705 02/09/16 0324 02/11/16 0517  NA 137 137 142  K 3.6 3.7 3.9  CL 104 105 112*  CO2 26 27 22   GLUCOSE 112* 111* 76  BUN 16 13 7   CREATININE 1.08 0.92 0.81  CALCIUM 9.0 8.9 8.2*   GFR: Estimated Creatinine Clearance: 108.3 mL/min (by C-G formula based on SCr of 0.81 mg/dL). Liver Function Tests:  Recent Labs Lab 02/08/16 1705 02/10/16 0407 02/11/16 0517  AST 38 27 25  ALT 36 26 22  ALKPHOS 75 65 49  BILITOT 1.3* 1.7* 1.5*  PROT 6.6 6.4* 5.4*  ALBUMIN 3.6 3.5 3.1*   No results for input(s): LIPASE, AMYLASE in the last 168 hours. No results for input(s): AMMONIA in the last 168 hours. Coagulation Profile:  Recent Labs Lab 02/09/16 0324  INR 1.09   Cardiac Enzymes:  Recent Labs Lab 02/08/16 1705  TROPONINI <0.03   BNP (last 3 results) No results for input(s): PROBNP in the last 8760 hours. HbA1C: No results for input(s): HGBA1C in the last 72 hours. CBG: No results for input(s): GLUCAP in the last 168 hours. Lipid Profile: No results for input(s): CHOL, HDL, LDLCALC, TRIG, CHOLHDL, LDLDIRECT in the last 72 hours. Thyroid Function Tests: No results for input(s): TSH, T4TOTAL, FREET4, T3FREE, THYROIDAB in the last 72 hours. Anemia Panel: No results for input(s): VITAMINB12, FOLATE, FERRITIN, TIBC, IRON, RETICCTPCT in the last 72 hours. Sepsis Labs: No results for input(s): PROCALCITON, LATICACIDVEN in the last 168 hours.  No results found for this or any previous visit (from the past 240 hour(s)).       Radiology Studies: No results found.      Scheduled Meds: . ferrous sulfate  325 mg Oral BID WC  . pantoprazole  40 mg Oral Daily  . sodium chloride flush  3 mL Intravenous Q12H    Continuous Infusions: . sodium chloride 100 mL/hr at 02/12/16 1206     LOS: 2 days    Time spent: 35 minutes,.     Alba Coryegalado, Amirah Goerke A, MD [facility] Pager [phone]  If 7PM-7AM, please contact night-coverage [email or web address] Password [email or web address] 02/12/2016, 1:03 PM

## 2016-02-13 ENCOUNTER — Encounter (HOSPITAL_COMMUNITY): Payer: Self-pay | Admitting: Radiology

## 2016-02-13 ENCOUNTER — Inpatient Hospital Stay (HOSPITAL_COMMUNITY): Payer: BLUE CROSS/BLUE SHIELD

## 2016-02-13 LAB — BASIC METABOLIC PANEL
Anion gap: 4 — ABNORMAL LOW (ref 5–15)
BUN: 5 mg/dL — AB (ref 6–20)
CHLORIDE: 106 mmol/L (ref 101–111)
CO2: 29 mmol/L (ref 22–32)
CREATININE: 0.87 mg/dL (ref 0.61–1.24)
Calcium: 9 mg/dL (ref 8.9–10.3)
Glucose, Bld: 86 mg/dL (ref 65–99)
POTASSIUM: 3.7 mmol/L (ref 3.5–5.1)
SODIUM: 139 mmol/L (ref 135–145)

## 2016-02-13 LAB — CBC
HCT: 27.3 % — ABNORMAL LOW (ref 39.0–52.0)
HEMOGLOBIN: 8.3 g/dL — AB (ref 13.0–17.0)
MCH: 21.7 pg — ABNORMAL LOW (ref 26.0–34.0)
MCHC: 30.4 g/dL (ref 30.0–36.0)
MCV: 71.3 fL — ABNORMAL LOW (ref 78.0–100.0)
PLATELETS: 89 10*3/uL — AB (ref 150–400)
RBC: 3.83 MIL/uL — AB (ref 4.22–5.81)
RDW: 20.7 % — ABNORMAL HIGH (ref 11.5–15.5)
WBC: 5.2 10*3/uL (ref 4.0–10.5)

## 2016-02-13 MED ORDER — IOPAMIDOL (ISOVUE-300) INJECTION 61%
INTRAVENOUS | Status: AC
  Filled 2016-02-13: qty 30

## 2016-02-13 MED ORDER — IOPAMIDOL (ISOVUE-300) INJECTION 61%
INTRAVENOUS | Status: AC
  Administered 2016-02-13: 100 mL
  Filled 2016-02-13: qty 100

## 2016-02-13 NOTE — Progress Notes (Signed)
Pt went to CT scan for abdominal pain around 2000 and returned around 2020. Pt is in room now eating dinner sitting along bedside.

## 2016-02-13 NOTE — Progress Notes (Signed)
PROGRESS NOTE    Leonard Fowler  ZOX:[id number] DOB: Oct 18, 1975 DOA: 02/08/2016 PCP: No PCP Per Patient    Brief Narrative:  Leonard Fowler is a 40 y.o. male with no known past medical history who originates from [place] and has been here for almost one year, now presenting with approximately 1 months of progressive fatigue, exertional dyspnea, constant and vague chest discomfort, and low hemoglobin with elevated d-dimer on outpatient labs. Patient reports enjoying good health until the insidious development of exertional dyspnea and a vague, constant, mild chest discomfort approximately one month ago that has progressively worsened and been associated with general weakness and fatigue. Patient denies any fevers or chills during this interval and as not had any cough. He denies abdominal pain, nausea, vomiting, diarrhea, melena, or hematochezia. He does not take any medications regularly and denies any use of alcohol or illicit substances. He does not know of any family history of thalassemia or anemia. Patient was evaluated earlier today and in outpatient clinic with lab work that was revealing of a low hemoglobin and elevated d-dimer. He was directed to the ED for further evaluation of this.   Assessment & Plan:   Principal Problem:   Symptomatic anemia Active Problems:   Iron deficiency   Thrombocytopenia (HCC)   Chest pain   Incidental pulmonary nodule   Anemia   Pulmonary nodules  1-Anemia; Iron deficiency.  Patient denies, any skin rash, pruritus or any worm contact. No eosinophilia.  He report some abdominal pain, and BM soon after eating, black stool.  GI consulted, planning endoscopy/colonoscopy in am.  Started  Oral ferrous sulfate.  Hb at 8.5, will continue with iron supplement.  Endoscopy, colonoscopy normal;   2-Thrombocytopenia;   Treat iron deficiency.  Follow trend.  Check CT abdomen  scan   3-Multiple  Pulmonary nodule.  Patient denies cough, night sweats.    Patient needs bronchoscopy, planned for Monday. .  Patient will benefit from inpatient bronchoscopy, he now wants to stay for procedure.   4-Chest pain, dyspnea; related to anemia, improved.   5-Hyperbilirubinemia; Hepatitis B antigen negative.  HIV negative.   6-Abdominal pain,  Check CT scan.   DVT prophylaxis: scd Code Status: full code.  Family Communication: care discussed with patient.  Disposition Plan: bronchoscopy on Monday   Consultants:   GI      Procedures: none   Antimicrobials: none   Subjective: Brother in law helps with translation  Patient complaining of abdominal pain.  Had BM   Objective: Vitals:   02/12/16 0445 02/12/16 1319 02/12/16 2100 02/13/16 0440  BP: (!) 95/49 112/62 108/65 105/72  Pulse: (!) 59 66 63 64  Resp: 19 17 18 16   Temp: 98.4 F (36.9 C) 98.2 F (36.8 C) 98.1 F (36.7 C) 97.6 F (36.4 C)  TempSrc: Oral Oral    SpO2: 99% 100% 100% 100%  Weight:      Height:        Intake/Output Summary (Last 24 hours) at 02/13/16 1118 Last data filed at 02/13/16 0400  Gross per 24 hour  Intake            10760 ml  Output                0 ml  Net            10760 ml   Filed Weights   02/09/16 0435 02/11/16 0841  Weight: 68.3 kg (150 lb 8 oz) 68 kg (150 lb)  Examination:  General exam: Appears calm and comfortable  Respiratory system: Clear to auscultation. Respiratory effort normal. Cardiovascular system: S1 & S2 heard, RRR. No JVD, murmurs, rubs, gallops or clicks. No pedal edema. Gastrointestinal system: Abdomen is nondistended, soft and nontender. No organomegaly or masses felt. Normal bowel sounds heard. Central nervous system: Alert and oriented. No focal neurological deficits. Extremities: Symmetric 5 x 5 power. Skin: No rashes, lesions or ulcers Psychiatry: Judgement and insight appear normal. Mood & affect appropriate.     Data Reviewed: I have personally reviewed following labs and imaging  studies  CBC:  Recent Labs Lab 02/08/16 1705 02/09/16 0324 02/10/16 0407 02/11/16 0517 02/12/16 0717 02/13/16 0432  WBC 4.1 5.3 7.0 5.1  --  5.2  NEUTROABS  --  3.5  --   --   --   --   HGB 6.3* 8.7* 9.6* 8.0* 8.5* 8.3*  HCT 21.6* 28.4* 31.3* 26.3* 28.9* 27.3*  MCV 66.7* 70.5* 70.2* 71.1*  --  71.3*  PLT 115* 117* 113* 99*  --  89*   Basic Metabolic Panel:  Recent Labs Lab 02/08/16 1705 02/09/16 0324 02/11/16 0517  NA 137 137 142  K 3.6 3.7 3.9  CL 104 105 112*  CO2 26 27 22   GLUCOSE 112* 111* 76  BUN 16 13 7   CREATININE 1.08 0.92 0.81  CALCIUM 9.0 8.9 8.2*   GFR: Estimated Creatinine Clearance: 108.3 mL/min (by C-G formula based on SCr of 0.81 mg/dL). Liver Function Tests:  Recent Labs Lab 02/08/16 1705 02/10/16 0407 02/11/16 0517  AST 38 27 25  ALT 36 26 22  ALKPHOS 75 65 49  BILITOT 1.3* 1.7* 1.5*  PROT 6.6 6.4* 5.4*  ALBUMIN 3.6 3.5 3.1*   No results for input(s): LIPASE, AMYLASE in the last 168 hours. No results for input(s): AMMONIA in the last 168 hours. Coagulation Profile:  Recent Labs Lab 02/09/16 0324  INR 1.09   Cardiac Enzymes:  Recent Labs Lab 02/08/16 1705  TROPONINI <0.03   BNP (last 3 results) No results for input(s): PROBNP in the last 8760 hours. HbA1C: No results for input(s): HGBA1C in the last 72 hours. CBG: No results for input(s): GLUCAP in the last 168 hours. Lipid Profile: No results for input(s): CHOL, HDL, LDLCALC, TRIG, CHOLHDL, LDLDIRECT in the last 72 hours. Thyroid Function Tests: No results for input(s): TSH, T4TOTAL, FREET4, T3FREE, THYROIDAB in the last 72 hours. Anemia Panel: No results for input(s): VITAMINB12, FOLATE, FERRITIN, TIBC, IRON, RETICCTPCT in the last 72 hours. Sepsis Labs: No results for input(s): PROCALCITON, LATICACIDVEN in the last 168 hours.  No results found for this or any previous visit (from the past 240 hour(s)).       Radiology Studies: No results  found.      Scheduled Meds: . ferrous sulfate  325 mg Oral BID WC  . pantoprazole  40 mg Oral Daily  . sodium chloride flush  3 mL Intravenous Q12H   Continuous Infusions: . sodium chloride 100 mL/hr at 02/13/16 0920     LOS: 3 days    Time spent: 35 minutes,.     Alba Coryegalado, Hadiyah Maricle A, MD [facility] Pager [phone]  If 7PM-7AM, please contact night-coverage [email or web address] Password [email or web address] 02/13/2016, 11:18 AM

## 2016-02-14 ENCOUNTER — Encounter (HOSPITAL_COMMUNITY): Payer: Self-pay | Admitting: Gastroenterology

## 2016-02-14 ENCOUNTER — Encounter (HOSPITAL_COMMUNITY)
Admission: EM | Disposition: A | Discharge status: Home / Self Care | Payer: Self-pay | Source: Home / Self Care | Attending: Internal Medicine

## 2016-02-14 ENCOUNTER — Encounter: Payer: Self-pay | Admitting: Gastroenterology

## 2016-02-14 ENCOUNTER — Inpatient Hospital Stay (HOSPITAL_COMMUNITY): Payer: BLUE CROSS/BLUE SHIELD

## 2016-02-14 DIAGNOSIS — Z808 Family history of malignant neoplasm of other organs or systems: Secondary | ICD-10-CM

## 2016-02-14 DIAGNOSIS — Z9889 Other specified postprocedural states: Secondary | ICD-10-CM

## 2016-02-14 DIAGNOSIS — D649 Anemia, unspecified: Secondary | ICD-10-CM

## 2016-02-14 DIAGNOSIS — Z8489 Family history of other specified conditions: Secondary | ICD-10-CM

## 2016-02-14 DIAGNOSIS — J189 Pneumonia, unspecified organism: Secondary | ICD-10-CM

## 2016-02-14 DIAGNOSIS — R0902 Hypoxemia: Secondary | ICD-10-CM

## 2016-02-14 HISTORY — PX: VIDEO BRONCHOSCOPY: SHX5072

## 2016-02-14 LAB — COMPREHENSIVE METABOLIC PANEL
ALT: 24 U/L (ref 17–63)
ANION GAP: 7 (ref 5–15)
AST: 32 U/L (ref 15–41)
Albumin: 3 g/dL — ABNORMAL LOW (ref 3.5–5.0)
Alkaline Phosphatase: 55 U/L (ref 38–126)
BUN: 7 mg/dL (ref 6–20)
CHLORIDE: 103 mmol/L (ref 101–111)
CO2: 28 mmol/L (ref 22–32)
Calcium: 8.7 mg/dL — ABNORMAL LOW (ref 8.9–10.3)
Creatinine, Ser: 0.95 mg/dL (ref 0.61–1.24)
GFR calc non Af Amer: >60 mL/min (ref >60)
Glucose, Bld: 107 mg/dL — ABNORMAL HIGH (ref 65–99)
POTASSIUM: 3.4 mmol/L — AB (ref 3.5–5.1)
SODIUM: 138 mmol/L (ref 135–145)
Total Bilirubin: 1 mg/dL (ref 0.3–1.2)
Total Protein: 5.4 g/dL — ABNORMAL LOW (ref 6.5–8.1)

## 2016-02-14 LAB — CBC
HCT: 28.6 % — ABNORMAL LOW (ref 39.0–52.0)
Hemoglobin: 8.7 g/dL — ABNORMAL LOW (ref 13.0–17.0)
MCH: 22.2 pg — AB (ref 26.0–34.0)
MCHC: 30.4 g/dL (ref 30.0–36.0)
MCV: 73 fL — ABNORMAL LOW (ref 78.0–100.0)
PLATELETS: 89 10*3/uL — AB (ref 150–400)
RBC: 3.92 MIL/uL — AB (ref 4.22–5.81)
RDW: 21.9 % — AB (ref 11.5–15.5)
WBC: 5.4 10*3/uL (ref 4.0–10.5)

## 2016-02-14 LAB — CRYPTOCOCCAL ANTIGEN: CRYPTO AG: NEGATIVE

## 2016-02-14 SURGERY — VIDEO BRONCHOSCOPY WITHOUT FLUORO
Anesthesia: Moderate Sedation | Laterality: Bilateral

## 2016-02-14 MED ORDER — PHENYLEPHRINE HCL 0.25 % NA SOLN
NASAL | Status: DC | PRN
  Administered 2016-02-14: 2 via NASAL

## 2016-02-14 MED ORDER — LIDOCAINE HCL 2 % EX GEL
CUTANEOUS | Status: DC | PRN
  Administered 2016-02-14: 1

## 2016-02-14 MED ORDER — SODIUM CHLORIDE 0.9 % IV SOLN
30.0000 meq | Freq: Once | INTRAVENOUS | Status: AC
  Administered 2016-02-14: 30 meq via INTRAVENOUS
  Filled 2016-02-14: qty 15

## 2016-02-14 MED ORDER — FENTANYL CITRATE (PF) 100 MCG/2ML IJ SOLN
INTRAMUSCULAR | Status: AC
  Filled 2016-02-14: qty 4

## 2016-02-14 MED ORDER — SODIUM CHLORIDE 0.9 % IV SOLN
Freq: Once | INTRAVENOUS | Status: AC
  Administered 2016-02-14: 13:00:00 via INTRAVENOUS

## 2016-02-14 MED ORDER — MIDAZOLAM HCL 5 MG/ML IJ SOLN
INTRAMUSCULAR | Status: AC
  Filled 2016-02-14: qty 2

## 2016-02-14 MED ORDER — LIDOCAINE HCL (PF) 1 % IJ SOLN
INTRAMUSCULAR | Status: DC | PRN
  Administered 2016-02-14: 6 mL

## 2016-02-14 MED ORDER — MIDAZOLAM HCL 10 MG/2ML IJ SOLN
INTRAMUSCULAR | Status: DC | PRN
  Administered 2016-02-14 (×3): 1 mg via INTRAVENOUS

## 2016-02-14 MED ORDER — FENTANYL CITRATE (PF) 100 MCG/2ML IJ SOLN
INTRAMUSCULAR | Status: DC | PRN
  Administered 2016-02-14 (×4): 50 ug via INTRAVENOUS

## 2016-02-14 NOTE — Progress Notes (Signed)
Pt back from bronchoscopy. Pt drowsy. VSS. Pt now resting in bed at lowest position and call bell/phone is within reach. Bed alarm set for patient d/t sedating procedure. Will continue to monitor pt. Family member is at bedside.

## 2016-02-14 NOTE — Progress Notes (Signed)
PROGRESS NOTE    Leonard Fowler  [id number] DOB: December 17, 1975 DOA: 02/08/2016 PCP: No PCP Per Patient    Brief Narrative:  Leonard Fowler is a 40 y.o. male with no known past medical history who originates from [place] and has been here for almost one year, now presenting with approximately 1 months of progressive fatigue, exertional dyspnea, constant and vague chest discomfort, and low hemoglobin with elevated d-dimer on outpatient labs. Patient reports enjoying good health until the insidious development of exertional dyspnea and a vague, constant, mild chest discomfort approximately one month ago that has progressively worsened and been associated with general weakness and fatigue. Patient denies any fevers or chills during this interval and as not had any cough. He denies abdominal pain, nausea, vomiting, diarrhea, melena, or hematochezia. He does not take any medications regularly and denies any use of alcohol or illicit substances. He does not know of any family history of thalassemia or anemia. Patient was evaluated earlier today and in outpatient clinic with lab work that was revealing of a low hemoglobin and elevated d-dimer. He was directed to the ED for further evaluation of this.   Assessment & Plan:   Principal Problem:   Symptomatic anemia Active Problems:   Iron deficiency   Thrombocytopenia (HCC)   Chest pain   Incidental pulmonary nodule   Anemia   Pulmonary nodules  1-Anemia; Iron deficiency.  Patient denies, any skin rash, pruritus or any worm contact. No eosinophilia.  He report some abdominal pain, and BM soon after eating, black stool.  Started Oral ferrous sulfate.  Hb at 8.7, will continue with iron supplement.  Endoscopy, colonoscopy normal;   2-Thrombocytopenia;   Treat iron deficiency.  Follow trend.  Ct abdomen with no splenomegaly.   3-Multiple  Pulmonary nodule.  Patient denies cough, night sweats.  Patient needs bronchoscopy, planned for today     4-Chest pain, dyspnea; related to anemia, improved.   5-Hyperbilirubinemia; Hepatitis B antigen negative.  HIV negative.   6-Abdominal pain,  CT scan negative.  Continue with Pepcid.  Follow up outpatient if persist   7-Hypokalemia; replete   DVT prophylaxis: scd Code Status: full code.  Family Communication: care discussed with patient.  Disposition Plan: bronchoscopy on Monday   Consultants:   GI   CCM    Procedures: none   Antimicrobials: none   Subjective: Brother in law helps with translation, still with mild abdominal pain/  Awaiting bronchoscopy   Objective: Vitals:   02/13/16 0440 02/13/16 1301 02/13/16 2245 02/14/16 0445  BP: 105/72 (!) 105/59 108/68 113/64  Pulse: 64 67 60 (!) 43  Resp: 16 18 18 18   Temp: 97.6 F (36.4 C) 97.7 F (36.5 C) 97.9 F (36.6 C) 97.9 F (36.6 C)  TempSrc:  Oral Oral Oral  SpO2: 100% 100% 96% 97%  Weight:      Height:        Intake/Output Summary (Last 24 hours) at 02/14/16 1305 Last data filed at 02/14/16 0534  Gross per 24 hour  Intake          2916.67 ml  Output                0 ml  Net          2916.67 ml   Filed Weights   02/09/16 0435 02/11/16 0841  Weight: 68.3 kg (150 lb 8 oz) 68 kg (150 lb)    Examination:  General exam: Appears calm and comfortable  Respiratory system: Clear  to auscultation. Respiratory effort normal. Cardiovascular system: S1 & S2 heard, RRR. No JVD, murmurs, rubs, gallops or clicks. No pedal edema. Gastrointestinal system: Abdomen is nondistended, soft and nontender. No organomegaly or masses felt. Normal bowel sounds heard. Central nervous system: Alert and oriented. No focal neurological deficits. Extremities: Symmetric 5 x 5 power. Skin: No rashes, lesions or ulcers Psychiatry: Judgement and insight appear normal. Mood & affect appropriate.     Data Reviewed: I have personally reviewed following labs and imaging studies  CBC:  Recent Labs Lab 02/09/16 0324  02/10/16 0407 02/11/16 0517 02/12/16 0717 02/13/16 0432 02/14/16 0623  WBC 5.3 7.0 5.1  --  5.2 5.4  NEUTROABS 3.5  --   --   --   --   --   HGB 8.7* 9.6* 8.0* 8.5* 8.3* 8.7*  HCT 28.4* 31.3* 26.3* 28.9* 27.3* 28.6*  MCV 70.5* 70.2* 71.1*  --  71.3* 73.0*  PLT 117* 113* 99*  --  89* 89*   Basic Metabolic Panel:  Recent Labs Lab 02/08/16 1705 02/09/16 0324 02/11/16 0517 02/13/16 1208 02/14/16 0623  NA 137 137 142 139 138  K 3.6 3.7 3.9 3.7 3.4*  CL 104 105 112* 106 103  CO2 26 27 22 29 28   GLUCOSE 112* 111* 76 86 107*  BUN 16 13 7  5* 7  CREATININE 1.08 0.92 0.81 0.87 0.95  CALCIUM 9.0 8.9 8.2* 9.0 8.7*   GFR: Estimated Creatinine Clearance: 92.3 mL/min (by C-G formula based on SCr of 0.95 mg/dL). Liver Function Tests:  Recent Labs Lab 02/08/16 1705 02/10/16 0407 02/11/16 0517 02/14/16 0623  AST 38 27 25 32  ALT 36 26 22 24   ALKPHOS 75 65 49 55  BILITOT 1.3* 1.7* 1.5* 1.0  PROT 6.6 6.4* 5.4* 5.4*  ALBUMIN 3.6 3.5 3.1* 3.0*   No results for input(s): LIPASE, AMYLASE in the last 168 hours. No results for input(s): AMMONIA in the last 168 hours. Coagulation Profile:  Recent Labs Lab 02/09/16 0324  INR 1.09   Cardiac Enzymes:  Recent Labs Lab 02/08/16 1705  TROPONINI <0.03   BNP (last 3 results) No results for input(s): PROBNP in the last 8760 hours. HbA1C: No results for input(s): HGBA1C in the last 72 hours. CBG: No results for input(s): GLUCAP in the last 168 hours. Lipid Profile: No results for input(s): CHOL, HDL, LDLCALC, TRIG, CHOLHDL, LDLDIRECT in the last 72 hours. Thyroid Function Tests: No results for input(s): TSH, T4TOTAL, FREET4, T3FREE, THYROIDAB in the last 72 hours. Anemia Panel: No results for input(s): VITAMINB12, FOLATE, FERRITIN, TIBC, IRON, RETICCTPCT in the last 72 hours. Sepsis Labs: No results for input(s): PROCALCITON, LATICACIDVEN in the last 168 hours.  No results found for this or any previous visit (from the past  240 hour(s)).       Radiology Studies: Ct Abdomen Pelvis W Contrast  Result Date: 02/13/2016 CLINICAL DATA:  Stabbing mid abdominal pain. EXAM: CT ABDOMEN AND PELVIS WITH CONTRAST TECHNIQUE: Multidetector CT imaging of the abdomen and pelvis was performed using the standard protocol following bolus administration of intravenous contrast. CONTRAST:  ISOVUE-300 IOPAMIDOL (ISOVUE-300) INJECTION 61% COMPARISON:  None. FINDINGS: Lower chest: Tiny bilateral pleural effusions. Hepatobiliary: No focal liver abnormality is seen. No gallstones, gallbladder wall thickening, or biliary dilatation. Pancreas: Unremarkable. No pancreatic ductal dilatation or surrounding inflammatory changes. Spleen: Normal in size without focal abnormality. Adrenals/Urinary Tract: Adrenal glands are unremarkable. Kidneys are without renal calculi, focal lesion, or hydronephrosis. Scattered bilateral few mm renal cysts.  Bladder is unremarkable. Stomach/Bowel: Stomach is within normal limits. No evidence appendicitis. No evidence of bowel wall thickening, distention, or inflammatory changes. Vascular/Lymphatic: No significant vascular findings are present. No enlarged abdominal or pelvic lymph nodes. Reproductive: Prostate is unremarkable. Other: No abdominal wall hernia or abnormality. No abdominopelvic ascites. Musculoskeletal: No acute or significant osseous findings. IMPRESSION: No acute abnormality within the abdomen or pelvis. Tiny bilateral pleural effusions. Electronically Signed   By: Ted Mcalpine M.D.   On: 02/13/2016 22:20        Scheduled Meds: . [MAR Hold] ferrous sulfate  325 mg Oral BID WC  . [MAR Hold] pantoprazole  40 mg Oral Daily  . potassium chloride (KCL MULTIRUN) 30 mEq in 265 mL IVPB  30 mEq Intravenous Once  . [MAR Hold] sodium chloride flush  3 mL Intravenous Q12H   Continuous Infusions: . sodium chloride 100 mL/hr at 02/14/16 0534     LOS: 4 days    Time spent: 35 minutes,.      Alba Cory, MD [facility] Pager [phone]  If 7PM-7AM, please contact night-coverage [email or web address] Password [email or web address] 02/14/2016, 1:05 PM

## 2016-02-14 NOTE — Progress Notes (Signed)
Video bronchoscopy performed.  Intervention bronchial washing.  No complications noted.  Will continue to monitor.  Sachin Ferencz G. Chella Chapdelaine, M.D. [facility]. Pager: [phone]. After hours pager: [phone]. 

## 2016-02-14 NOTE — Progress Notes (Signed)
Name: Golden PopJohn K Sommerville MRN: [id number] DOB: 06-16-1975    ADMISSION DATE:  02/08/2016 CONSULTATION DATE:  02/10/16  REFERRING MD :  Dr. Sunnie Nielsenegalado   CHIEF COMPLAINT:  SOB   HISTORY OF PRESENT ILLNESS:  [age] y/ M with PMH of appendectomy who [facility] after being sent from his PCP to the ER for evaluation of anemia, elevated D-Dimer, SOB and chest pain.    SUBJECTIVE:  No complaints  Hs been NPO VITAL SIGNS: Temp:  [97.7 F (36.5 C)-97.9 F (36.6 C)] 97.9 F (36.6 C) (12/11 0445) Pulse Rate:  [43-67] 43 (12/11 0445) Resp:  [18] 18 (12/11 0445) BP: (105-113)/(59-68) 113/64 (12/11 0445) SpO2:  [96 %-100 %] 97 % (12/11 0445)  PHYSICAL EXAMINATION: General:  Well developed young adult male in NAD, supine in bed on RA Neuro:  AAOx4, speech clear, MAE  HEENT:  MM pink/moist, no jvd Cardiovascular:  s1s2 rrr, no m/r/g  Lungs:  Even/non-labored, lungs bilaterally clear  Abdomen:  Flat, soft, mild tenderness to palpation Musculoskeletal:  No acute deformities  Skin:  No rashes or lesions    Recent Labs Lab 02/11/16 0517 02/13/16 1208 02/14/16 0623  NA 142 139 138  K 3.9 3.7 3.4*  CL 112* 106 103  CO2 22 29 28   BUN 7 5* 7  CREATININE 0.81 0.87 0.95  GLUCOSE 76 86 107*     Recent Labs Lab 02/11/16 0517 02/12/16 0717 02/13/16 0432 02/14/16 0623  HGB 8.0* 8.5* 8.3* 8.7*  HCT 26.3* 28.9* 27.3* 28.6*  WBC 5.1  --  5.2 5.4  PLT 99*  --  89* 89*    Ct Abdomen Pelvis W Contrast  Result Date: 02/13/2016 CLINICAL DATA:  Stabbing mid abdominal pain. EXAM: CT ABDOMEN AND PELVIS WITH CONTRAST TECHNIQUE: Multidetector CT imaging of the abdomen and pelvis was performed using the standard protocol following bolus administration of intravenous contrast. CONTRAST:  100mL ISOVUE-300 IOPAMIDOL (ISOVUE-300) INJECTION 61% COMPARISON:  None. FINDINGS: Lower chest: Tiny bilateral pleural effusions. Hepatobiliary: No focal liver abnormality is seen. No gallstones,  gallbladder wall thickening, or biliary dilatation. Pancreas: Unremarkable. No pancreatic ductal dilatation or surrounding inflammatory changes. Spleen: Normal in size without focal abnormality. Adrenals/Urinary Tract: Adrenal glands are unremarkable. Kidneys are without renal calculi, focal lesion, or hydronephrosis. Scattered bilateral few mm renal cysts. Bladder is unremarkable. Stomach/Bowel: Stomach is within normal limits. No evidence appendicitis. No evidence of bowel wall thickening, distention, or inflammatory changes. Vascular/Lymphatic: No significant vascular findings are present. No enlarged abdominal or pelvic lymph nodes. Reproductive: Prostate is unremarkable. Other: No abdominal wall hernia or abnormality. No abdominopelvic ascites. Musculoskeletal: No acute or significant osseous findings. IMPRESSION: No acute abnormality within the abdomen or pelvis. Tiny bilateral pleural effusions. Electronically Signed   By: Ted Mcalpineobrinka  Dimitrova M.D.   On: 02/13/2016 22:20      SIGNIFICANT EVENTS  12/05  Admit with chest pain, exertional dyspnea, fatigue, low Hgb on outpatient labs  STUDIES:  CTA Chest 12/5 >> neg for PE, incidental finding of multiple pulmonary nodules, largest measuring 8 mm   ASSESSMENT / PLAN:  Multiple Pulmonary Nodules  largest measuring 8 mm, differential dx includes fissural lymph nodes, fungal / bacterial infection, hamartoma / benign finding, malignancy.  Concern for possible fungal infection due to multiple risk factors -immigrant from [place] 1 yr ago. No certain TB exposure,  he works with [employer] which puts him at significant risk for fungal process or psittacosis.  Difficult to correlate the nodules with findings  of Fe deficiency anemia, thrombocytopenia, esophageal thickening although there may be a unifying diagnosis, especially if this is malignancy. Histo, Psittaci  vs TB.   plan - arrange for FOB for washings / potential biopsy >> set up for 12/11 at 1PM  today - pt will need pulmonary follow up  - assess serum and urine histoplasma antigen   Simonne MartinetPeter E Babcock ACNP-BC [facility] Pager # [phone] OR # [phone] if no answer  Attending Note:  40 year old male with no PMH who [facility] with SOB and anemia.  CT of the chest that I reviewed myself revealed 2 pulmonary nodules.  On exam, patient is breathing comfortably with clear lungs.  Discussed with PCCM-NP.  Pulmonary nodules:  - Bronch with wash today.  - No biopsy unless obvious endobronchial masses are noted.  Hypoxemia:  - Titrate O2 for sat of 88-92%.  - May need ambulatory desaturation prior to discharge for ? Of home O2.  May consider an ID consult given tropical and subsaharan infections that can cause anemia.  PCCM will follow.  Patient seen and examined, agree with above note.  I dictated the care and orders written for this patient under my direction.  Alyson ReedyWesam G Yacoub, MD [phone]

## 2016-02-14 NOTE — Progress Notes (Signed)
Pt being transferred to 62M-13 by bed with facial mask.. Report called to receiving nurse, Latoya,RN. All questions answered.

## 2016-02-14 NOTE — Consult Note (Signed)
[facility]  Date of Admission:  02/08/2016  Date of Consult:  02/14/2016  Reason for Consult: pulmonary nodules Referring Physician: Dr. Tyrell Antonio  Impression/Recommendation  1. Multiple Pulmonary Nodules: underwent bronchoscopy this afternoon with further data pending.  Given country of origin, patient at risk for infections not typically seen here but not entirely sure of the relationship to pulmonary manifestations, iron deficiciency, thrombocytopneia.  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  Patient also has normal ESR, sodium, creatinine/BUN, and AST/ALT; all of which can be deranged with Psittacosis, but this remains a possibility.  - follow up data from bronchoscopy - follow up Pneumocystis and Histoplasmosis urine antigen testing - obtain Chlamydia panel - obtain serum Crypotococcal antigen - obtain Fungal antibody panel - obtain Strongyloides antibody - add AFB stain and culture  - Stool ova and parasite  Thank you so much for this interesting consult,   Jule Ser (pager) [phone] [email or web address]  Leonard Fowler is an 40 y.o. male.  HPI: Patient originally from [place], has been in the [place] for 1 year now.  He has no significant PMHx and was admitted 12/5 after outpatient labs showed a decreased hemoglobin and elevated d-dimer.  He reported about 1 month of increasing SOB with DOE and fatigue plus chest discomfort and abdominal pain.  SOB to the point of being fatigued with such tasks as carrying his child or walking up a flight of stairs.  He denies further symptoms such as fever, chills, night sweats, productive cough, nausea or vomiting.  Denies prior TB exposure.  He reported having chest discomfort and abdominal pain with intermittent melena.  FOBT was negative on admission.    On admission, patient found to have a hemoglobin of 6.3, labs  consistent with iron deficiency, thrombocytopenia, and CTA with multiple pulmonary nodules, largest measuring 41m.  There was no leukocytosis and differential on CBC was normal.  He was afebrile.  Given his iron deficiency and possibly described melena, patient underwent EGD and colonoscopy on 12/8, both of which were unremarkable.  He underwent bronchoscopy this afternoon with further results pending.  Other labs thus far include negative hepatitis serologies for B and C, negative HIV.  Histoplasma urinary antigen is pending as are results from his bronchoscopy and Pneumocystis smear.  Patient is married with 2 children.  Works at [employer] and has been doing so for many months.  Friend is concerned regarding this contributing to pulmonary nodules due to possible chemical exposure.  Denies tobacco use or alcohol use.  Past Medical History:  Diagnosis Date  . Medical history non-contributory     Past Surgical History:  Procedure Laterality Date  . APPENDECTOMY  1990s  . COLONOSCOPY N/A 02/11/2016   Procedure: COLONOSCOPY;  Surgeon: MLadene Artist MD;  [facility];  Service: Endoscopy;  Laterality: N/A;  . ESOPHAGOGASTRODUODENOSCOPY N/A 02/11/2016   Procedure: ESOPHAGOGASTRODUODENOSCOPY (EGD);  Surgeon: MLadene Artist MD;  [facility];  Service: Endoscopy;  Laterality: N/A;     No Known Allergies  Medications: I have reviewed the patient's current medications.  Abtx:  Anti-infectives    None      Total days of antibiotics: 0          Social History:  reports that he has never smoked. He has never used smokeless tobacco. He reports that he does not drink alcohol or use drugs.  Family History  Problem Relation Age of Onset  . Anemia Neg Hx   . Cancer Neg Hx     12-point ROS noted in HPI, otherwise negative.  Blood pressure 134/72, pulse 81, temperature 97.6 F (36.4 C), temperature source Oral, resp. rate (!) 21, height 5' 6"  (1.676  m), weight 150 lb (68 kg), SpO2 94 %.  General: resting in bed, sleepy but arousable. HEENT: PERRL, EOMI, no scleral icterus, moist mucus membranes, no thrush Cardiac: RRR, no rubs, murmurs or gallops Neck: no lymphadenopathy Pulm: normal effort, clear to auscultation bilaterally, no wheezes or crackles Abd: soft, mild diffuse tenderness without rebound or guarding, nondistended, BS present Ext: warm and well perfused, no pedal edema Skin: dry and intact Neuro: alert and oriented X3, cranial nerves II-XII grossly intact Psych: normal mood and affect    Results for orders placed or performed during the hospital encounter of 02/08/16 (from the past 48 hour(s))  CBC     Status: Abnormal   Collection Time: 02/13/16  4:32 AM  Result Value Ref Range   WBC 5.2 4.0 - 10.5 K/uL   RBC 3.83 (L) 4.22 - 5.81 MIL/uL   Hemoglobin 8.3 (L) 13.0 - 17.0 g/dL   HCT 27.3 (L) 39.0 - 52.0 %   MCV 71.3 (L) 78.0 - 100.0 fL   MCH 21.7 (L) 26.0 - 34.0 pg   MCHC 30.4 30.0 - 36.0 g/dL   RDW 20.7 (H) 11.5 - 15.5 %   Platelets 89 (L) 150 - 400 K/uL    Comment: REPEATED TO VERIFY CONSISTENT WITH PREVIOUS RESULT   Basic metabolic panel     Status: Abnormal   Collection Time: 02/13/16 12:08 PM  Result Value Ref Range   Sodium 139 135 - 145 mmol/L   Potassium 3.7 3.5 - 5.1 mmol/L   Chloride 106 101 - 111 mmol/L   CO2 29 22 - 32 mmol/L   Glucose, Bld 86 65 - 99 mg/dL   BUN 5 (L) 6 - 20 mg/dL   Creatinine, Ser 0.87 0.61 - 1.24 mg/dL   Calcium 9.0 8.9 - 10.3 mg/dL   GFR calc non Af Amer >60 >60 mL/min   GFR calc Af Amer >60 >60 mL/min    Comment: (NOTE) The eGFR has been calculated using the CKD EPI equation. This calculation has not been validated in all clinical situations. eGFR's persistently <60 mL/min signify possible Chronic Kidney Disease.    Anion gap 4 (L) 5 - 15  CBC     Status: Abnormal   Collection Time: 02/14/16  6:23 AM  Result Value Ref Range   WBC 5.4 4.0 - 10.5 K/uL    Comment:  CONSISTENT WITH PREVIOUS RESULT   RBC 3.92 (L) 4.22 - 5.81 MIL/uL   Hemoglobin 8.7 (L) 13.0 - 17.0 g/dL    Comment: CONSISTENT WITH PREVIOUS RESULT   HCT 28.6 (L) 39.0 - 52.0 %   MCV 73.0 (L) 78.0 - 100.0 fL   MCH 22.2 (L) 26.0 - 34.0 pg   MCHC 30.4 30.0 - 36.0 g/dL   RDW 21.9 (H) 11.5 - 15.5 %   Platelets 89 (L) 150 - 400 K/uL    Comment: REPEATED TO VERIFY PLATELET COUNT CONFIRMED BY SMEAR   Comprehensive metabolic panel     Status: Abnormal   Collection Time: 02/14/16  6:23 AM  Result Value Ref Range   Sodium 138 135 - 145 mmol/L   Potassium 3.4 (L) 3.5 - 5.1 mmol/L   Chloride 103 101 - 111 mmol/L  CO2 28 22 - 32 mmol/L   Glucose, Bld 107 (H) 65 - 99 mg/dL   BUN 7 6 - 20 mg/dL   Creatinine, Ser 0.95 0.61 - 1.24 mg/dL   Calcium 8.7 (L) 8.9 - 10.3 mg/dL   Total Protein 5.4 (L) 6.5 - 8.1 g/dL   Albumin 3.0 (L) 3.5 - 5.0 g/dL   AST 32 15 - 41 U/L   ALT 24 17 - 63 U/L   Alkaline Phosphatase 55 38 - 126 U/L   Total Bilirubin 1.0 0.3 - 1.2 mg/dL   GFR calc non Af Amer >60 >60 mL/min   GFR calc Af Amer >60 >60 mL/min    Comment: (NOTE) The eGFR has been calculated using the CKD EPI equation. This calculation has not been validated in all clinical situations. eGFR's persistently <60 mL/min signify possible Chronic Kidney Disease.    Anion gap 7 5 - 15   No results found for: SDES, SPECREQUEST, CULT, REPTSTATUS Ct Abdomen Pelvis W Contrast  Result Date: 02/13/2016 CLINICAL DATA:  Stabbing mid abdominal pain. EXAM: CT ABDOMEN AND PELVIS WITH CONTRAST TECHNIQUE: Multidetector CT imaging of the abdomen and pelvis was performed using the standard protocol following bolus administration of intravenous contrast. CONTRAST:  190m ISOVUE-300 IOPAMIDOL (ISOVUE-300) INJECTION 61% COMPARISON:  None. FINDINGS: Lower chest: Tiny bilateral pleural effusions. Hepatobiliary: No focal liver abnormality is seen. No gallstones, gallbladder wall thickening, or biliary dilatation. Pancreas:  Unremarkable. No pancreatic ductal dilatation or surrounding inflammatory changes. Spleen: Normal in size without focal abnormality. Adrenals/Urinary Tract: Adrenal glands are unremarkable. Kidneys are without renal calculi, focal lesion, or hydronephrosis. Scattered bilateral few mm renal cysts. Bladder is unremarkable. Stomach/Bowel: Stomach is within normal limits. No evidence appendicitis. No evidence of bowel wall thickening, distention, or inflammatory changes. Vascular/Lymphatic: No significant vascular findings are present. No enlarged abdominal or pelvic lymph nodes. Reproductive: Prostate is unremarkable. Other: No abdominal wall hernia or abnormality. No abdominopelvic ascites. Musculoskeletal: No acute or significant osseous findings. IMPRESSION: No acute abnormality within the abdomen or pelvis. Tiny bilateral pleural effusions. Electronically Signed   By: DFidela SalisburyM.D.   On: 02/13/2016 22:20   No results found for this or any previous visit (from the past 240 hour(s)).    02/14/2016, 3:13 PM     LOS: 4 days    Records and images were personally reviewed where available.

## 2016-02-14 NOTE — Care Management Note (Signed)
Case Management Note  Patient Details  Name: Leonard Fowler MRN: [id number] Date of Birth: January 25, 1976  Subjective/Objective:      Admitted with symptomatic anemia, noted incidental lung nodules hospital course. From home with family.          Action/Plan: Plan: bronchoscopy today, possible d/c after procedure.  Expected Discharge Date:    02/14/2016          Expected Discharge Plan:  Home/Self Care  In-House Referral:     Discharge planning Services  CM Consult  Status of Service:  completed  If discussed at Long Length of Stay Meetings, dates discussed:    Additional Comments:  [name], [name], RN 02/14/2016, 11:36 AM

## 2016-02-14 NOTE — Procedures (Signed)
Bronchoscopy Procedure Note Leonard Fowler Comes [id number] [birth year]  Procedure: Bronchoscopy Indications: Obtain specimens for culture and/or other diagnostic studies  Procedure Details Consent: Risks of procedure as well as the alternatives and risks of each were explained to the (patient/caregiver).  Consent for procedure obtained. Via interpreter  Time Out: Verified patient identification, verified procedure, site/side was marked, verified correct patient position, special equipment/implants available, medications/allergies/relevent history reviewed, required imaging and test results available.  Performed  In preparation for procedure, patient was given 100% FiO2 and bronchoscope lubricated. Sedation: Benzodiazepines and Fentanyl  Airway entered and the following bronchi were examined: RUL, RML, RLL, LUL, LLL and Bronchi.   BAL from RLL and LUL. Airway was extremely irritated from severe coughing and noted for significant bronchorhea.  Bronchoscope removed.    Evaluation Hemodynamic Status: BP stable throughout; O2 sats: stable throughout Patient's Current Condition: stable Specimens:  Sent purulent fluid Complications: No apparent complications Patient did tolerate procedure well.   Leonard Fowler 02/14/2016

## 2016-02-14 NOTE — Progress Notes (Signed)
New order for Airborne Precautions. Called Dr Sunnie Nielsenegalado regarding no airborne beds on 5W. [facility].

## 2016-02-15 ENCOUNTER — Inpatient Hospital Stay (HOSPITAL_COMMUNITY): Payer: BLUE CROSS/BLUE SHIELD

## 2016-02-15 DIAGNOSIS — J9811 Atelectasis: Secondary | ICD-10-CM

## 2016-02-15 LAB — ACID FAST SMEAR (AFB)

## 2016-02-15 LAB — ACID FAST SMEAR (AFB, MYCOBACTERIA): Acid Fast Smear: NEGATIVE

## 2016-02-15 LAB — PNEUMOCYSTIS JIROVECI SMEAR BY DFA: PNEUMOCYSTIS JIROVECI AG: NEGATIVE

## 2016-02-15 MED ORDER — MENTHOL 3 MG MT LOZG
1.0000 | LOZENGE | OROMUCOSAL | Status: DC | PRN
  Administered 2016-02-15: 3 mg via ORAL
  Filled 2016-02-15: qty 9

## 2016-02-15 NOTE — Progress Notes (Signed)
PROGRESS NOTE    Leonard PopJohn K Marschall  ZOX:[id number] DOB: 17-Oct-1975 DOA: 02/08/2016 PCP: No PCP Per Patient    Brief Narrative:  Leonard Fowler is a 40 y.o. male with no known past medical history who originates from [place] and has been here for almost one year, now presenting with approximately 1 months of progressive fatigue, exertional dyspnea, constant and vague chest discomfort, and low hemoglobin with elevated d-dimer on outpatient labs. Patient reports enjoying good health until the insidious development of exertional dyspnea and a vague, constant, mild chest discomfort approximately one month ago that has progressively worsened and been associated with general weakness and fatigue.  Patient underwent evaluation of iron deficiency anemia with endoscopy, colonoscopy which was negative for any source of bleeding. A CT abdomen was negative for mass or lymphadenopathy.   He was found to have bilateral pulmonary nodules, for which he underwent bronchoscopy 12-11. ID was consulted. Cultures for AFB, Fungus , bacteria pending at this time.    Assessment & Plan:   Principal Problem:   Symptomatic anemia Active Problems:   Iron deficiency   Thrombocytopenia (HCC)   Chest pain   Incidental pulmonary nodule   Anemia   Pulmonary nodules   Hypoxemia   Atelectasis  1-Multiple  Pulmonary nodule.  Patient denies cough, night sweats.  Patient underwent bronchoscopy 12-11, airway was irritated and significant secretion was notice.  ID consulted.  Awaiting culture, AFB, Chlamydia panel, Pneumocystis Smear.  On Airborne until AFB results available.  Histoplasma antigen pending.   Anemia; Iron deficiency.  Patient denies, any skin rash, pruritus or any worm contact. No eosinophilia.  He report some abdominal pain, and BM soon after eating, black stool.  Started Oral ferrous sulfate.  Hb at 8.7, will continue with iron supplement.  Endoscopy, colonoscopy normal;   2-Thrombocytopenia;     Treat iron deficiency.  Follow trend.  Ct abdomen with no splenomegaly.    4-Chest pain, dyspnea; related to anemia, improved.   5-Hyperbilirubinemia; Hepatitis B antigen negative.  HIV negative.   6-Abdominal pain,  CT scan negative.  Continue with Pepcid.   7-Hypokalemia; repeat labs in am.    DVT prophylaxis: scd Code Status: full code.  Family Communication: care discussed with patient.  Disposition Plan: awaiting results of culture   Consultants:   GI   CCM    Procedures: none   Antimicrobials: none   Subjective: Use translator line.  Patient report improvement of sore throat and chest pain.  Denies cough.   Objective: Vitals:   02/14/16 2138 02/15/16 0110 02/15/16 0600 02/15/16 0940  BP: (!) 112/52 (!) 103/58 (!) 100/59 116/61  Pulse: 64 67 67 69  Resp: 20 20 20 18   Temp: 99.1 F (37.3 C) 98.8 F (37.1 C) 98.5 F (36.9 C) 98.2 F (36.8 C)  TempSrc: Oral Oral Oral Oral  SpO2: 100% 99% 99% 97%  Weight:      Height:        Intake/Output Summary (Last 24 hours) at 02/15/16 1451 Last data filed at 02/15/16 0600  Gross per 24 hour  Intake              240 ml  Output                0 ml  Net              240 ml   Filed Weights   02/09/16 0435 02/11/16 0841 02/14/16 1304  Weight: 68.3 kg (150 lb 8 oz)  68 kg (150 lb) 68 kg (150 lb)    Examination:  General exam: Appears calm and comfortable  Respiratory system: Clear to auscultation. Respiratory effort normal. Cardiovascular system: S1 & S2 heard, RRR. No JVD, murmurs, rubs, gallops or clicks. No pedal edema. Gastrointestinal system: Abdomen is nondistended, soft and nontender. No organomegaly or masses felt. Normal bowel sounds heard. Central nervous system: Alert and oriented. No focal neurological deficits. Extremities: Symmetric 5 x 5 power. Skin: No rashes, lesions or ulcers Psychiatry: Judgement and insight appear normal. Mood & affect appropriate.     Data Reviewed: I have  personally reviewed following labs and imaging studies  CBC:  Recent Labs Lab 02/09/16 0324 02/10/16 0407 02/11/16 0517 02/12/16 0717 02/13/16 0432 02/14/16 0623  WBC 5.3 7.0 5.1  --  5.2 5.4  NEUTROABS 3.5  --   --   --   --   --   HGB 8.7* 9.6* 8.0* 8.5* 8.3* 8.7*  HCT 28.4* 31.3* 26.3* 28.9* 27.3* 28.6*  MCV 70.5* 70.2* 71.1*  --  71.3* 73.0*  PLT 117* 113* 99*  --  89* 89*   Basic Metabolic Panel:  Recent Labs Lab 02/08/16 1705 02/09/16 0324 02/11/16 0517 02/13/16 1208 02/14/16 0623  NA 137 137 142 139 138  K 3.6 3.7 3.9 3.7 3.4*  CL 104 105 112* 106 103  CO2 26 27 22 29 28   GLUCOSE 112* 111* 76 86 107*  BUN 16 13 7  5* 7  CREATININE 1.08 0.92 0.81 0.87 0.95  CALCIUM 9.0 8.9 8.2* 9.0 8.7*   GFR: Estimated Creatinine Clearance: 92.3 mL/min (by C-G formula based on SCr of 0.95 mg/dL). Liver Function Tests:  Recent Labs Lab 02/08/16 1705 02/10/16 0407 02/11/16 0517 02/14/16 0623  AST 38 27 25 32  ALT 36 26 22 24   ALKPHOS 75 65 49 55  BILITOT 1.3* 1.7* 1.5* 1.0  PROT 6.6 6.4* 5.4* 5.4*  ALBUMIN 3.6 3.5 3.1* 3.0*   No results for input(s): LIPASE, AMYLASE in the last 168 hours. No results for input(s): AMMONIA in the last 168 hours. Coagulation Profile:  Recent Labs Lab 02/09/16 0324  INR 1.09   Cardiac Enzymes:  Recent Labs Lab 02/08/16 1705  TROPONINI <0.03   BNP (last 3 results) No results for input(s): PROBNP in the last 8760 hours. HbA1C: No results for input(s): HGBA1C in the last 72 hours. CBG: No results for input(s): GLUCAP in the last 168 hours. Lipid Profile: No results for input(s): CHOL, HDL, LDLCALC, TRIG, CHOLHDL, LDLDIRECT in the last 72 hours. Thyroid Function Tests: No results for input(s): TSH, T4TOTAL, FREET4, T3FREE, THYROIDAB in the last 72 hours. Anemia Panel: No results for input(s): VITAMINB12, FOLATE, FERRITIN, TIBC, IRON, RETICCTPCT in the last 72 hours. Sepsis Labs: No results for input(s): PROCALCITON,  LATICACIDVEN in the last 168 hours.  Recent Results (from the past 240 hour(s))  Culture, bal-quantitative     Status: None (Preliminary result)   Collection Time: 02/14/16  2:17 PM  Result Value Ref Range Status   Specimen Description BRONCHIAL ALVEOLAR LAVAGE  Final   Special Requests LU  Final   Gram Stain   Final    FEW WBC PRESENT, PREDOMINANTLY MONONUCLEAR FEW GRAM POSITIVE COCCI RARE GRAM NEGATIVE RODS    Culture CULTURE REINCUBATED FOR BETTER GROWTH  Final   Report Status PENDING  Incomplete  Culture, bal-quantitative     Status: None (Preliminary result)   Collection Time: 02/14/16  2:18 PM  Result Value Ref Range Status  Specimen Description BRONCHIAL ALVEOLAR LAVAGE  Final   Special Requests RL  Final   Gram Stain   Final    FEW WBC PRESENT, PREDOMINANTLY MONONUCLEAR FEW GRAM POSITIVE COCCI FEW GRAM NEGATIVE RODS RARE GRAM POSITIVE RODS    Culture CULTURE REINCUBATED FOR BETTER GROWTH  Final   Report Status PENDING  Incomplete         Radiology Studies: Ct Abdomen Pelvis W Contrast  Result Date: 02/13/2016 CLINICAL DATA:  Stabbing mid abdominal pain. EXAM: CT ABDOMEN AND PELVIS WITH CONTRAST TECHNIQUE: Multidetector CT imaging of the abdomen and pelvis was performed using the standard protocol following bolus administration of intravenous contrast. CONTRAST:  100mL ISOVUE-300 IOPAMIDOL (ISOVUE-300) INJECTION 61% COMPARISON:  None. FINDINGS: Lower chest: Tiny bilateral pleural effusions. Hepatobiliary: No focal liver abnormality is seen. No gallstones, gallbladder wall thickening, or biliary dilatation. Pancreas: Unremarkable. No pancreatic ductal dilatation or surrounding inflammatory changes. Spleen: Normal in size without focal abnormality. Adrenals/Urinary Tract: Adrenal glands are unremarkable. Kidneys are without renal calculi, focal lesion, or hydronephrosis. Scattered bilateral few mm renal cysts. Bladder is unremarkable. Stomach/Bowel: Stomach is within  normal limits. No evidence appendicitis. No evidence of bowel wall thickening, distention, or inflammatory changes. Vascular/Lymphatic: No significant vascular findings are present. No enlarged abdominal or pelvic lymph nodes. Reproductive: Prostate is unremarkable. Other: No abdominal wall hernia or abnormality. No abdominopelvic ascites. Musculoskeletal: No acute or significant osseous findings. IMPRESSION: No acute abnormality within the abdomen or pelvis. Tiny bilateral pleural effusions. Electronically Signed   By: Ted Mcalpineobrinka  Dimitrova M.D.   On: 02/13/2016 22:20   Dg Chest Port 1 View  Result Date: 02/15/2016 CLINICAL DATA:  Evaluate for chest wall discomfort since yesterday EXAM: PORTABLE CHEST 1 VIEW COMPARISON:  02/08/2016 FINDINGS: Heart is upper limits normal in size. Lungs are clear. No effusions or acute bony abnormality. IMPRESSION: No active disease. Electronically Signed   By: Charlett NoseKevin  Dover M.D.   On: 02/15/2016 11:45        Scheduled Meds: . ferrous sulfate  325 mg Oral BID WC  . pantoprazole  40 mg Oral Daily  . sodium chloride flush  3 mL Intravenous Q12H   Continuous Infusions: . sodium chloride 100 mL/hr at 02/15/16 0036     LOS: 5 days    Time spent: 35 minutes,.     Alba Coryegalado, Mariaisabel Bodiford A, MD [facility] Pager [phone]  If 7PM-7AM, please contact night-coverage [email or web address] Password [email or web address] 02/15/2016, 2:51 PM

## 2016-02-15 NOTE — Progress Notes (Signed)
Name: Leonard Fowler K Kuster MRN: [id number] DOB: Jul 28, 1975    ADMISSION DATE:  02/08/2016 CONSULTATION DATE:  02/10/16  REFERRING MD :  Dr. Sunnie Nielsenegalado   CHIEF COMPLAINT:  SOB   HISTORY OF PRESENT ILLNESS:  40 y/ M with PMH of appendectomy who [facility] after being sent from his PCP to the ER for evaluation of anemia, elevated D-Dimer, SOB and chest pain.    SUBJECTIVE:  C/o sore throat and a little more chest discomfort after bronch yesterday Spoke w/ pt's brother who provided interpretation  VITAL SIGNS: Temp:  [97.6 F (36.4 C)-99.1 F (37.3 C)] 98.5 F (36.9 C) (12/12 0600) Pulse Rate:  [56-102] 67 (12/12 0600) Resp:  [12-25] 20 (12/12 0600) BP: (100-166)/(52-125) 100/59 (12/12 0600) SpO2:  [90 %-100 %] 99 % (12/12 0600) Weight:  [150 lb (68 kg)] 150 lb (68 kg) (12/11 1304)  PHYSICAL EXAMINATION: General:  Well developed young adult male in NAD, supine in bed on RA Neuro:  AAOx4, speech clear, MAE  HEENT:  MM pink/moist, no jvd Cardiovascular:  s1s2 rrr, no m/r/g  Lungs:  Even/non-labored, lungs bilaterally clear  Abdomen:  Flat, soft, mild tenderness to palpation Musculoskeletal:  No acute deformities  Skin:  No rashes or lesions    Recent Labs Lab 02/11/16 0517 02/13/16 1208 02/14/16 0623  NA 142 139 138  K 3.9 3.7 3.4*  CL 112* 106 103  CO2 22 29 28   BUN 7 5* 7  CREATININE 0.81 0.87 0.95  GLUCOSE 76 86 107*     Recent Labs Lab 02/11/16 0517 02/12/16 0717 02/13/16 0432 02/14/16 0623  HGB 8.0* 8.5* 8.3* 8.7*  HCT 26.3* 28.9* 27.3* 28.6*  WBC 5.1  --  5.2 5.4  PLT 99*  --  89* 89*    Ct Abdomen Pelvis W Contrast  Result Date: 02/13/2016 CLINICAL DATA:  Stabbing mid abdominal pain. EXAM: CT ABDOMEN AND PELVIS WITH CONTRAST TECHNIQUE: Multidetector CT imaging of the abdomen and pelvis was performed using the standard protocol following bolus administration of intravenous contrast. CONTRAST:  100mL ISOVUE-300 IOPAMIDOL (ISOVUE-300)  INJECTION 61% COMPARISON:  None. FINDINGS: Lower chest: Tiny bilateral pleural effusions. Hepatobiliary: No focal liver abnormality is seen. No gallstones, gallbladder wall thickening, or biliary dilatation. Pancreas: Unremarkable. No pancreatic ductal dilatation or surrounding inflammatory changes. Spleen: Normal in size without focal abnormality. Adrenals/Urinary Tract: Adrenal glands are unremarkable. Kidneys are without renal calculi, focal lesion, or hydronephrosis. Scattered bilateral few mm renal cysts. Bladder is unremarkable. Stomach/Bowel: Stomach is within normal limits. No evidence appendicitis. No evidence of bowel wall thickening, distention, or inflammatory changes. Vascular/Lymphatic: No significant vascular findings are present. No enlarged abdominal or pelvic lymph nodes. Reproductive: Prostate is unremarkable. Other: No abdominal wall hernia or abnormality. No abdominopelvic ascites. Musculoskeletal: No acute or significant osseous findings. IMPRESSION: No acute abnormality within the abdomen or pelvis. Tiny bilateral pleural effusions. Electronically Signed   By: Ted Mcalpineobrinka  Dimitrova M.D.   On: 02/13/2016 22:20     SIGNIFICANT EVENTS  12/05  Admit with chest pain, exertional dyspnea, fatigue, low Hgb on outpatient labs  STUDIES:  CTA Chest 12/5 >> neg for PE, incidental finding of multiple pulmonary nodules, largest measuring 8 mm   ASSESSMENT / PLAN:  Multiple Pulmonary Nodules -->likely infectious in origin  largest measuring 8 mm, differential dx includes fissural lymph nodes, fungal / bacterial infection, hamartoma / benign finding, malignancy.  -underwent FOB 12/11 -ID consulted & appreciate input.  Diagnostic results thus far: -HIV NR  Cryptococcal antigen: negative  plan F/u the following:  Serum Histoplasma ABs>> Urine Histo antigen>>> Cytology >>> BAL>> Pneumocystis smear>> AFB smear>>> AFB culture >>> Fungal Antibody panel>> Serum Chlamydia  panel>> Strongyloides antibody>> Cepacol for sore throat  cxr to r/o post-FOB complications   Simonne MartinetPeter E Babcock ACNP-BC [facility] Pulmonary/Critical Care Pager # [phone] OR # [phone] if no answer  Attending Note:  40 year old male with no PMH who [facility] with SOB and anemia.  CT of the chest that I reviewed myself revealed 2 pulmonary nodules.  On exam, patient is breathing comfortably with bibasilar crackles ?atelectais.  Discussed with PCCM-NP.  Atelectasis:  - IS per RT protocol  Pulmonary nodules:             - Bronch with wash results pending.             - Appreciate input from ID, will continue to follow  Airway irritation: likely due to coughing and evidence of bronchorhea.  - Cough suppression PRN  - F/U with ID.  Hypoxemia:             - Titrate O2 for sat of 88-92%.             - May need ambulatory desaturation prior to discharge for ? Of home O2.  PCCM will follow.  Patient seen and examined, agree with above note.  I dictated the care and orders written for this patient under my direction.  Alyson ReedyWesam G Conrad Zajkowski, MD [phone]

## 2016-02-15 NOTE — Progress Notes (Signed)
INFECTIOUS DISEASE PROGRESS NOTE  ID: Leonard Fowler is a 40 y.o. male with  Principal Problem:   Symptomatic anemia Active Problems:   Iron deficiency   Thrombocytopenia (HCC)   Chest pain   Incidental pulmonary nodule   Anemia   Pulmonary nodules   Hypoxemia  Subjective: Patient seen and examined this morning with assistance of [name] interpreter phone.  Patient reports improvement in throat pain and coughing experienced after bronchoscopy yesterday.  Otherwise, has no new complaints.  Abtx:  Anti-infectives    None      Medications:  Scheduled: . ferrous sulfate  325 mg Oral BID WC  . pantoprazole  40 mg Oral Daily  . sodium chloride flush  3 mL Intravenous Q12H   Continuous: . sodium chloride 100 mL/hr at 02/15/16 0036   ZOX:WRUEAVPRN:sodium chloride, acetaminophen **OR** acetaminophen, HYDROcodone-acetaminophen, ondansetron **OR** ondansetron (ZOFRAN) IV, polyethylene glycol, sodium chloride flush  Objective: Vital signs in last 24 hours: Temp:  [97.6 F (36.4 C)-99.1 F (37.3 C)] 98.2 F (36.8 C) (12/12 0940) Pulse Rate:  [56-102] 69 (12/12 0940) Resp:  [12-25] 18 (12/12 0940) BP: (100-166)/(52-125) 116/61 (12/12 0940) SpO2:  [90 %-100 %] 97 % (12/12 0940) Weight:  [150 lb (68 kg)] 150 lb (68 kg) (12/11 1304)   General appearance: alert, cooperative and no distress Resp: normal effort Cardio: regular rate and rhythm, S1, S2 normal, no murmur, click, rub or gallop GI: soft, non-tender, non-distended Extremities: extremities normal, atraumatic, no cyanosis or edema Skin: Skin color, texture, turgor normal. No rashes or lesions Neurologic: Grossly normal  Lab Results  Recent Labs  02/13/16 0432 02/13/16 1208 02/14/16 0623  WBC 5.2  --  5.4  HGB 8.3*  --  8.7*  HCT 27.3*  --  28.6*  NA  --  139 138  K  --  3.7 3.4*  CL  --  106 103  CO2  --  29 28  BUN  --  5* 7  CREATININE  --  0.87 0.95   Liver Panel  Recent Labs  02/14/16 0623  PROT 5.4*    ALBUMIN 3.0*  AST 32  ALT 24  ALKPHOS 55  BILITOT 1.0   Sedimentation Rate No results for input(s): ESRSEDRATE in the last 72 hours. C-Reactive Protein No results for input(s): CRP in the last 72 hours.  Microbiology: Recent Results (from the past 240 hour(s))  Culture, bal-quantitative     Status: None (Preliminary result)   Collection Time: 02/14/16  2:17 PM  Result Value Ref Range Status   Specimen Description BRONCHIAL ALVEOLAR LAVAGE  Final   Special Requests LU  Final   Gram Stain   Final    FEW WBC PRESENT, PREDOMINANTLY MONONUCLEAR FEW GRAM POSITIVE COCCI RARE GRAM NEGATIVE RODS    Culture PENDING  Incomplete   Report Status PENDING  Incomplete  Culture, bal-quantitative     Status: None (Preliminary result)   Collection Time: 02/14/16  2:18 PM  Result Value Ref Range Status   Specimen Description BRONCHIAL ALVEOLAR LAVAGE  Final   Special Requests RL  Final   Gram Stain   Final    FEW WBC PRESENT, PREDOMINANTLY MONONUCLEAR FEW GRAM POSITIVE COCCI FEW GRAM NEGATIVE RODS RARE GRAM POSITIVE RODS    Culture PENDING  Incomplete   Report Status PENDING  Incomplete    Studies/Results: Ct Abdomen Pelvis W Contrast  Result Date: 02/13/2016 CLINICAL DATA:  Stabbing mid abdominal pain. EXAM: CT ABDOMEN AND PELVIS WITH CONTRAST TECHNIQUE: Multidetector  CT imaging of the abdomen and pelvis was performed using the standard protocol following bolus administration of intravenous contrast. CONTRAST:  100mL ISOVUE-300 IOPAMIDOL (ISOVUE-300) INJECTION 61% COMPARISON:  None. FINDINGS: Lower chest: Tiny bilateral pleural effusions. Hepatobiliary: No focal liver abnormality is seen. No gallstones, gallbladder wall thickening, or biliary dilatation. Pancreas: Unremarkable. No pancreatic ductal dilatation or surrounding inflammatory changes. Spleen: Normal in size without focal abnormality. Adrenals/Urinary Tract: Adrenal glands are unremarkable. Kidneys are without renal calculi,  focal lesion, or hydronephrosis. Scattered bilateral few mm renal cysts. Bladder is unremarkable. Stomach/Bowel: Stomach is within normal limits. No evidence appendicitis. No evidence of bowel wall thickening, distention, or inflammatory changes. Vascular/Lymphatic: No significant vascular findings are present. No enlarged abdominal or pelvic lymph nodes. Reproductive: Prostate is unremarkable. Other: No abdominal wall hernia or abnormality. No abdominopelvic ascites. Musculoskeletal: No acute or significant osseous findings. IMPRESSION: No acute abnormality within the abdomen or pelvis. Tiny bilateral pleural effusions. Electronically Signed   By: Ted Mcalpineobrinka  Dimitrova M.D.   On: 02/13/2016 22:20     Assessment/Plan: 1. Multiple Pulmonary Nodules: s/p bronchoscopy 12/11 with PCCM.  Multiple labs sent yesterday afternoon and are still pending.  Gram stain from BAL with few GPC, few GNR, rare GP rods.  Culture pending.  Hepatitis and HIV serologies negative.   - Cryptococcal antigen negative - f/u BAL culture and cytology results - f/u histoplasma urine antigen - f/u Pneumocystis smear - f/u AFB smear and culture.  Continue airborne until resulted - f/u Fungal antibody panel - f/u Chlamydia panel - f/u Strongyloides antibody - f/u stool O&P  Total days of antibiotics: 0         Gwynn BurlyAndrew Wallace Infectious Diseases (pager) [phone] [email or web address] 02/15/2016, 10:04 AM  LOS: 5 days

## 2016-02-16 LAB — CULTURE, BAL-QUANTITATIVE W GRAM STAIN: Culture: 60000 — AB

## 2016-02-16 LAB — MISC LABCORP TEST (SEND OUT): LABCORP TEST CODE: 164000

## 2016-02-16 LAB — CULTURE, BAL-QUANTITATIVE: CULTURE: NORMAL — AB

## 2016-02-16 LAB — BASIC METABOLIC PANEL
Anion gap: 6 (ref 5–15)
BUN: 8 mg/dL (ref 6–20)
CHLORIDE: 107 mmol/L (ref 101–111)
CO2: 29 mmol/L (ref 22–32)
Calcium: 8.8 mg/dL — ABNORMAL LOW (ref 8.9–10.3)
Creatinine, Ser: 1.12 mg/dL (ref 0.61–1.24)
GFR calc Af Amer: >60 mL/min (ref >60)
GFR calc non Af Amer: >60 mL/min (ref >60)
GLUCOSE: 107 mg/dL — AB (ref 65–99)
POTASSIUM: 3.8 mmol/L (ref 3.5–5.1)
Sodium: 142 mmol/L (ref 135–145)

## 2016-02-16 LAB — CBC
HEMATOCRIT: 31 % — AB (ref 39.0–52.0)
Hemoglobin: 9.2 g/dL — ABNORMAL LOW (ref 13.0–17.0)
MCH: 22.1 pg — ABNORMAL LOW (ref 26.0–34.0)
MCHC: 29.7 g/dL — AB (ref 30.0–36.0)
MCV: 74.5 fL — AB (ref 78.0–100.0)
Platelets: 101 10*3/uL — ABNORMAL LOW (ref 150–400)
RBC: 4.16 MIL/uL — ABNORMAL LOW (ref 4.22–5.81)
RDW: 24.7 % — ABNORMAL HIGH (ref 11.5–15.5)
WBC: 6.4 10*3/uL (ref 4.0–10.5)

## 2016-02-16 MED ORDER — DOXYCYCLINE HYCLATE 100 MG PO CAPS: 100.0000 mg | ORAL_CAPSULE | Freq: Two times a day (BID) | ORAL | 0 refills | Status: DC

## 2016-02-16 NOTE — Progress Notes (Signed)
INFECTIOUS DISEASE PROGRESS NOTE  ID: Leonard Fowler is a 40 y.o. male with  Principal Problem:   Symptomatic anemia Active Problems:   Iron deficiency   Thrombocytopenia (HCC)   Chest pain   Incidental pulmonary nodule   Anemia   Pulmonary nodules   Hypoxemia   Atelectasis  Subjective: Patient seen and examined this morning with assistance of his friend, Charna ElizabethYani, who helped with translation.  Patient is doing well, sore throat pain improved with Cepacol drops.  No fevers or cough.  Abtx:  Anti-infectives    None      Medications:  Scheduled: . ferrous sulfate  325 mg Oral BID WC  . pantoprazole  40 mg Oral Daily  . sodium chloride flush  3 mL Intravenous Q12H   Continuous: . sodium chloride 100 mL/hr at 02/15/16 0036   ZOX:WRUEAVPRN:sodium chloride, acetaminophen **OR** acetaminophen, HYDROcodone-acetaminophen, menthol-cetylpyridinium, ondansetron **OR** ondansetron (ZOFRAN) IV, polyethylene glycol, sodium chloride flush  Objective: Vital signs in last 24 hours: Temp:  [98.2 F (36.8 C)-99 F (37.2 C)] 99 F (37.2 C) (12/13 0129) Pulse Rate:  [58-77] 76 (12/13 0129) Resp:  [18-19] 18 (12/13 0129) BP: (111-130)/(61-84) 111/64 (12/13 0129) SpO2:  [97 %-100 %] 97 % (12/13 0129)   General appearance: alert, cooperative and no distress Resp: normal effort Cardio: regular rate and rhythm, S1, S2 normal, no murmur, click, rub or gallop GI: soft, non-tender, non-distended Extremities: extremities normal, atraumatic, no cyanosis or edema Skin: Skin color, texture, turgor normal. No rashes or lesions Neurologic: Grossly normal  Lab Results  Recent Labs  02/14/16 0623 02/16/16 0327  WBC 5.4 6.4  HGB 8.7* 9.2*  HCT 28.6* 31.0*  NA 138 142  K 3.4* 3.8  CL 103 107  CO2 28 29  BUN 7 8  CREATININE 0.95 1.12   Liver Panel  Recent Labs  02/14/16 0623  PROT 5.4*  ALBUMIN 3.0*  AST 32  ALT 24  ALKPHOS 55  BILITOT 1.0   Sedimentation Rate No results for  input(s): ESRSEDRATE in the last 72 hours. C-Reactive Protein No results for input(s): CRP in the last 72 hours.  Microbiology: Recent Results (from the past 240 hour(s))  Culture, bal-quantitative     Status: None (Preliminary result)   Collection Time: 02/14/16  2:17 PM  Result Value Ref Range Status   Specimen Description BRONCHIAL ALVEOLAR LAVAGE  Final   Special Requests LU  Final   Gram Stain   Final    FEW WBC PRESENT, PREDOMINANTLY MONONUCLEAR FEW GRAM POSITIVE COCCI RARE GRAM NEGATIVE RODS    Culture CULTURE REINCUBATED FOR BETTER GROWTH  Final   Report Status PENDING  Incomplete  Pneumocystis smear by DFA     Status: None   Collection Time: 02/14/16  2:17 PM  Result Value Ref Range Status   Specimen Source-PJSRC BRONCHIAL ALVEOLAR LAVAGE  Final   Pneumocystis jiroveci Ag NEGATIVE  Final    Comment: [facility]formed [facility]  Acid Fast Smear (AFB)     Status: None   Collection Time: 02/14/16  2:17 PM  Result Value Ref Range Status   AFB Specimen Processing Concentration  Final   Acid Fast Smear Negative  Final    Comment: (NOTE) Performed At: [facility] [address] [place], [place] [id number] Mila HomerHancock William F MD NW:[id number]:[phone]    Source (AFB) BRONCHIAL ALVEOLAR LAVAGE  Corrected    Comment: CORRECTED ON 12/11 AT 1803: PREVIOUSLY REPORTED AS BLOOD  Culture, bal-quantitative  Status: None (Preliminary result)   Collection Time: 02/14/16  2:18 PM  Result Value Ref Range Status   Specimen Description BRONCHIAL ALVEOLAR LAVAGE  Final   Special Requests RL  Final   Gram Stain   Final    FEW WBC PRESENT, PREDOMINANTLY MONONUCLEAR FEW GRAM POSITIVE COCCI FEW GRAM NEGATIVE RODS RARE GRAM POSITIVE RODS    Culture CULTURE REINCUBATED FOR BETTER GROWTH  Final   Report Status PENDING  Incomplete    Studies/Results: Dg Chest Port 1 View  Result Date: 02/15/2016 CLINICAL DATA:  Evaluate for chest wall discomfort since yesterday EXAM:  PORTABLE CHEST 1 VIEW COMPARISON:  02/08/2016 FINDINGS: Heart is upper limits normal in size. Lungs are clear. No effusions or acute bony abnormality. IMPRESSION: No active disease. Electronically Signed   By: Charlett NoseKevin  Dover M.D.   On: 02/15/2016 11:45     Assessment/Plan: 1. Multiple Pulmonary Nodules: s/p bronchoscopy 12/11 with PCCM.  Multiple labs sent.  Gram stain from BAL with few GPC, few GNR, rare GP rods.  Culture with normal respiratory flora.  Hepatitis and HIV serologies negative.   - Cryptococcal antigen negative - Cytology with no malignant cells identified - Pneumocystis smear negative - Strongyloides antibody negative - AFB smear negative >>  D/c airborne precautions - AFB culture pending - f/u histoplasma urine antigen - f/u Fungal antibody panel - f/u Chlamydia panel - f/u stool O&P - if above are unrevealing, patient will likely need biopsy which can presumably be done as outpatient  Total days of antibiotics: 0         Gwynn BurlyAndrew Wallace Infectious Diseases (pager) [phone] [email or web address] 02/16/2016, 9:23 AM  LOS: 6 days   Could consider home with doxy for 2 weeks due to risk of psittachiosis Available as needed.

## 2016-02-16 NOTE — Care Management Note (Signed)
Case Management Note  Patient Details  Name: Leonard Fowler MRN: [id number] Date of Birth: 05/10/1975  Subjective/Objective:                    Action/Plan: Plan is for patient to discharge home when medically ready. Potential patient may need home oxygen at discharge. CM following for d/c needs.   Expected Discharge Date:                  Expected Discharge Plan:  Home/Self Care  In-House Referral:     Discharge planning Services  CM Consult  Post Acute Care Choice:    Choice offered to:     DME Arranged:    DME Agency:     HH Arranged:    HH Agency:     Status of Service:  In process, will continue to follow  If discussed at Long Length of Stay Meetings, dates discussed:    Additional Comments:  Leonard BaloKelli F Najmo Pardue, RN 02/16/2016, 11:18 AM

## 2016-02-16 NOTE — Discharge Instructions (Signed)
Follow with Pulmonology as scheduled  Please get a complete blood count and chemistry panel checked by your Primary MD at your next visit, and again as instructed by your Primary MD. Please get your medications reviewed and adjusted by your Primary MD.  Please request your Primary MD to go over all Hospital Tests and Procedure/Radiological results at the follow up, please get all Hospital records sent to your Prim MD by signing hospital release before you go home.  If you had Pneumonia of Lung problems at the Hospital: Please get a 2 view Chest X ray done in 6-8 weeks after hospital discharge or sooner if instructed by your Primary MD.  If you have Congestive Heart Failure: Please call your Cardiologist or Primary MD anytime you have any of the following symptoms:  1) 3 pound weight gain in 24 hours or 5 pounds in 1 week  2) shortness of breath, with or without a dry hacking cough  3) swelling in the hands, feet or stomach  4) if you have to sleep on extra pillows at night in order to breathe  Follow cardiac low salt diet and 1.5 lit/day fluid restriction.  If you have diabetes Accuchecks 4 times/day, Once in AM empty stomach and then before each meal. Log in all results and show them to your primary doctor at your next visit. If any glucose reading is under 80 or above 300 call your primary MD immediately.  If you have Seizure/Convulsions/Epilepsy: Please do not drive, operate heavy machinery, participate in activities at heights or participate in high speed sports until you have seen by Primary MD or a Neurologist and advised to do so again.  If you had Gastrointestinal Bleeding: Please ask your Primary MD to check a complete blood count within one week of discharge or at your next visit. Your endoscopic/colonoscopic biopsies that are pending at the time of discharge, will also need to followed by your Primary MD.  Get Medicines reviewed and adjusted. Please take all your medications  with you for your next visit with your Primary MD  Please request your Primary MD to go over all hospital tests and procedure/radiological results at the follow up, please ask your Primary MD to get all Hospital records sent to his/her office.  If you experience worsening of your admission symptoms, develop shortness of breath, life threatening emergency, suicidal or homicidal thoughts you must seek medical attention immediately by calling 911 or calling your MD immediately  if symptoms less severe.  You must read complete instructions/literature along with all the possible adverse reactions/side effects for all the Medicines you take and that have been prescribed to you. Take any new Medicines after you have completely understood and accpet all the possible adverse reactions/side effects.   Do not drive or operate heavy machinery when taking Pain medications.   Do not take more than prescribed Pain, Sleep and Anxiety Medications  Special Instructions: If you have smoked or chewed Tobacco  in the last 2 yrs please stop smoking, stop any regular Alcohol  and or any Recreational drug use.  Wear Seat belts while driving.  Please note You were cared for by a hospitalist during your hospital stay. If you have any questions about your discharge medications or the care you received while you were in the hospital after you are discharged, you can call the unit and asked to speak with the hospitalist on call if the hospitalist that took care of you is not available. Once you are discharged,  your primary care physician will handle any further medical issues. Please note that NO REFILLS for any discharge medications will be authorized once you are discharged, as it is imperative that you return to your primary care physician (or establish a relationship with a primary care physician if you do not have one) for your aftercare needs so that they can reassess your need for medications and monitor your lab  values.  You can reach the hospitalist office at phone [phone] or fax [phone]   If you do not have a primary care physician, you can call [phone] for a physician referral.  Activity: As tolerated with Full fall precautions use walker/cane & assistance as needed  Diet: regula  Disposition Home

## 2016-02-16 NOTE — Discharge Summary (Signed)
Physician Discharge Summary  Leonard Fowler ZOX:[id number] DOB: 1976/01/22 DOA: 02/08/2016  PCP: No PCP Per Patient  Admit date: 02/08/2016 Discharge date: 02/16/2016  Admitted From: home Disposition:  home  Recommendations for Outpatient Follow-up:  1. Follow up with Pulmonology on 12/28 as scheduled 2. Doxycycline for 14 days per ID recommendations   Home Health: none Equipment/Devices: none  Discharge Condition: stable CODE STATUS: Full Diet recommendation: regular  HPI: Leonard Fowler is a 40 y.o. male with no known past medical history who originates from [place] and has been here for almost one year, now presenting with approximately 1 months of progressive fatigue, exertional dyspnea, constant and vague chest discomfort, and low hemoglobin with elevated d-dimer on outpatient labs. Patient reports enjoying good health until the insidious development of exertional dyspnea and a vague, constant, mild chest discomfort approximately one month ago that has progressively worsened and been associated with general weakness and fatigue. Patient denies any fevers or chills during this interval and as not had any cough. He denies abdominal pain, nausea, vomiting, diarrhea, melena, or hematochezia. He does not take any medications regularly and denies any use of alcohol or illicit substances. He does not know of any family history of thalassemia or anemia. Patient was evaluated earlier today and in outpatient clinic with lab work that was revealing of a low hemoglobin and elevated d-dimer. He was directed to the ED for further evaluation of this.  Hospital Course: Discharge Diagnoses:  Principal Problem:   Symptomatic anemia Active Problems:   Iron deficiency   Thrombocytopenia (HCC)   Chest pain   Incidental pulmonary nodule   Anemia   Pulmonary nodules   Hypoxemia   Atelectasis  Multiple pulmonary nodules - Patient denies cough, night sweats. Pulmonary and ID consulted. Patient  underwent bronchoscopy 12-11, and he was maintained inpatient while AFB evaluation for TB was completed. Gram stain from BAL with few GPC, few GNR, rare GP rods.  Culture with normal respiratory flora.  Hepatitis and HIV serologies negative. Cryptococcal antigen, pneumocystis, Strongyloides and AFB smear all negative.ID recommends treatment for 2 weeks with doxyccyline for risk of psittachiosis Anemia; Iron deficiency. Patient denies, any skin rash, pruritus or any worm contact. No eosinophilia.  Thrombocytopenia - Treat iron deficiency. Ct abdomen with no splenomegaly.  Chest pain, dyspnea - related to anemia, improved.  Hyperbilirubinemia - Hepatitis B antigen negative. HIV negative. Resolved   Discharge Instructions  Discharge Instructions    Diet - low sodium heart healthy    Complete by:  As directed    Increase activity slowly    Complete by:  As directed        Medication List    STOP taking these medications   naproxen 500 MG tablet Commonly known as:  NAPROSYN     TAKE these medications   acetaminophen 325 MG tablet Commonly known as:  TYLENOL Take 2 tablets (650 mg total) by mouth every 6 (six) hours as needed for mild pain (or Fever >/= 101).   doxycycline 100 MG capsule Commonly known as:  VIBRAMYCIN Take 1 capsule (100 mg total) by mouth 2 (two) times daily.   ferrous sulfate 325 (65 FE) MG tablet Take 1 tablet (325 mg total) by mouth 3 (three) times daily with meals.   pantoprazole 40 MG tablet Commonly known as:  PROTONIX Take 1 tablet (40 mg total) by mouth daily.      Follow-up Information    Bevelyn NgoSarah F Groce, NP Follow up on 03/02/2016.  Specialty:  Pulmonary Disease Why:  at 11am Contact information: [address] [place] [address] [phone]          No Known Allergies  Consultations:  ID  Pulmonary   Procedures/Studies:  Bronch  Dg Chest 2 View  Result Date: 02/08/2016 CLINICAL DATA:  Chest pain, anemia,  shortness of breath. EXAM: CHEST  2 VIEW COMPARISON:  Chest CT earlier this day. FINDINGS: The cardiomediastinal contours are normal. The lungs are clear. Pulmonary nodules on CT are not well seen radiographically. Pulmonary vasculature is normal. No consolidation, pleural effusion, or pneumothorax. No acute osseous abnormalities are seen. IMPRESSION: No active cardiopulmonary disease. Pulmonary nodules on CT are not well seen radiographically. Electronically Signed   By: Rubye OaksMelanie  Ehinger M.D.   On: 02/08/2016 21:05   Dg Chest 2 View  Result Date: 02/08/2016 CLINICAL DATA:  Dyspnea on exertion with chest pain x1 month EXAM: CHEST  2 VIEW COMPARISON:  None. FINDINGS: The heart size and mediastinal contours are within normal limits. Both lungs are clear. The visualized skeletal structures are unremarkable. IMPRESSION: No active cardiopulmonary disease. Electronically Signed   By: Tollie Ethavid  Kwon M.D.   On: 02/08/2016 13:47   Ct Angio Chest Pe W And/or Wo Contrast  Result Date: 02/08/2016 CLINICAL DATA:  Chest pain and elevated D-dimer. Shortness of breath for 1 month. EXAM: CT ANGIOGRAPHY CHEST WITH CONTRAST TECHNIQUE: Multidetector CT imaging of the chest was performed using the standard protocol during bolus administration of intravenous contrast. Multiplanar CT image reconstructions and MIPs were obtained to evaluate the vascular anatomy. CONTRAST:  100 cc Isovue 370 IV COMPARISON:  Chest radiograph earlier this day. FINDINGS: Cardiovascular: Normal caliber isThere are no filling defects within the pulmonary arteries to suggest pulmonary embolus. Normal caliber thoracic aorta without evidence of dissection. Normal heart size. No pericardial effusion. Mediastinum/Nodes: No mediastinal or hilar adenopathy. Small bilateral axillary nodes are not enlarged by size criteria. Mild thickening of the distal esophagus. Visualized thyroid gland is normal. Trachea is midline. Lungs/Pleura: Mild dependent atelectasis. In  the perifissural left upper lobe there is a 8 x 7 mm nodule (8 mm mean diameter) on image 59 of series 5. There is a 4 mm right middle lobe nodule image 74. There are multiple bilateral perifissural and subpleural nodules measuring less than 5 mm. No pleural fluid. Upper Abdomen: Stomach is distended with ingested contents. No definite acute abnormality, paucity of intra-abdominal fat limits upper abdominal assessment. Musculoskeletal: There are no acute or suspicious osseous abnormalities. Review of the MIP images confirms the above findings. IMPRESSION: 1. No pulmonary embolus.  No acute intrathoracic abnormality. 2. **An incidental finding of potential clinical significance has been found. Multiple pulmonary nodules, majority are perifissural, largest measuring 8 mm. Non-contrast chest CT at 3-6 months is recommended. If the nodules are stable at time of repeat CT, then future CT at 18-24 months (from today's scan) is considered optional for low-risk patients, but is recommended for high-risk patients. This recommendation follows the consensus statement: Guidelines for Management of Incidental Pulmonary Nodules Detected on CT Images: From the Fleischner Society 2017; Radiology 2017; 284:228-243.** Electronically Signed   By: Rubye OaksMelanie  Ehinger M.D.   On: 02/08/2016 20:59   Ct Abdomen Pelvis W Contrast  Result Date: 02/13/2016 CLINICAL DATA:  Stabbing mid abdominal pain. EXAM: CT ABDOMEN AND PELVIS WITH CONTRAST TECHNIQUE: Multidetector CT imaging of the abdomen and pelvis was performed using the standard protocol following bolus administration of intravenous contrast. CONTRAST:  100mL ISOVUE-300  IOPAMIDOL (ISOVUE-300) INJECTION 61% COMPARISON:  None. FINDINGS: Lower chest: Tiny bilateral pleural effusions. Hepatobiliary: No focal liver abnormality is seen. No gallstones, gallbladder wall thickening, or biliary dilatation. Pancreas: Unremarkable. No pancreatic ductal dilatation or surrounding inflammatory  changes. Spleen: Normal in size without focal abnormality. Adrenals/Urinary Tract: Adrenal glands are unremarkable. Kidneys are without renal calculi, focal lesion, or hydronephrosis. Scattered bilateral few mm renal cysts. Bladder is unremarkable. Stomach/Bowel: Stomach is within normal limits. No evidence appendicitis. No evidence of bowel wall thickening, distention, or inflammatory changes. Vascular/Lymphatic: No significant vascular findings are present. No enlarged abdominal or pelvic lymph nodes. Reproductive: Prostate is unremarkable. Other: No abdominal wall hernia or abnormality. No abdominopelvic ascites. Musculoskeletal: No acute or significant osseous findings. IMPRESSION: No acute abnormality within the abdomen or pelvis. Tiny bilateral pleural effusions. Electronically Signed   By: Ted Mcalpine M.D.   On: 02/13/2016 22:20   Dg Chest Port 1 View  Result Date: 02/15/2016 CLINICAL DATA:  Evaluate for chest wall discomfort since yesterday EXAM: PORTABLE CHEST 1 VIEW COMPARISON:  02/08/2016 FINDINGS: Heart is upper limits normal in size. Lungs are clear. No effusions or acute bony abnormality. IMPRESSION: No active disease. Electronically Signed   By: Charlett Nose M.D.   On: 02/15/2016 11:45      Subjective: - no chest pain, shortness of breath, no abdominal pain, nausea or vomiting.   Discharge Exam: Vitals:   02/16/16 1013 02/16/16 1431  BP: 111/73 122/70  Pulse: 82 78  Resp: 18 18  Temp: 98.9 F (37.2 C) 98.4 F (36.9 C)   Vitals:   02/15/16 2220 02/16/16 0129 02/16/16 1013 02/16/16 1431  BP: 130/75 111/64 111/73 122/70  Pulse: (!) 58 76 82 78  Resp: 18 18 18 18   Temp: 98.2 F (36.8 C) 99 F (37.2 C) 98.9 F (37.2 C) 98.4 F (36.9 C)  TempSrc: Oral Oral Oral Oral  SpO2: 100% 97% 98% 98%  Weight:      Height:        General: Pt is alert, awake, not in acute distress Cardiovascular: RRR, S1/S2 +, no rubs, no gallops Respiratory: CTA bilaterally, no wheezing,  no rhonchi Abdominal: Soft, NT, ND, bowel sounds + Extremities: no edema, no cyanosis    The results of significant diagnostics from this hospitalization (including imaging, microbiology, ancillary and laboratory) are listed below for reference.     Microbiology: Recent Results (from the past 240 hour(s))  Culture, bal-quantitative     Status: Abnormal   Collection Time: 02/14/16  2:17 PM  Result Value Ref Range Status   Specimen Description BRONCHIAL ALVEOLAR LAVAGE  Final   Special Requests LU  Final   Gram Stain   Final    FEW WBC PRESENT, PREDOMINANTLY MONONUCLEAR FEW GRAM POSITIVE COCCI RARE GRAM NEGATIVE RODS    Culture (A)  Final    40,000 COLONIES/mL Consistent with normal respiratory flora.   Report Status 02/16/2016 FINAL  Final  Pneumocystis smear by DFA     Status: None   Collection Time: 02/14/16  2:17 PM  Result Value Ref Range Status   Specimen Source-PJSRC BRONCHIAL ALVEOLAR LAVAGE  Final   Pneumocystis jiroveci Ag NEGATIVE  Final    Comment: Performed at [facility]  Acid Fast Smear (AFB)     Status: None   Collection Time: 02/14/16  2:17 PM  Result Value Ref Range Status   AFB Specimen Processing Concentration  Final   Acid Fast Smear Negative  Final  Comment: (NOTE) Performed At: [facility] [address] [place], [place] [id number] Mila Homer MD WU:[id number]    Source (AFB) BRONCHIAL ALVEOLAR LAVAGE  Corrected    Comment: CORRECTED ON 12/11 AT 1803: PREVIOUSLY REPORTED AS BLOOD  Culture, bal-quantitative     Status: Abnormal   Collection Time: 02/14/16  2:18 PM  Result Value Ref Range Status   Specimen Description BRONCHIAL ALVEOLAR LAVAGE  Final   Special Requests RL  Final   Gram Stain   Final    FEW WBC PRESENT, PREDOMINANTLY MONONUCLEAR FEW GRAM POSITIVE COCCI FEW GRAM NEGATIVE RODS RARE GRAM POSITIVE RODS    Culture (A)  Final    60,000 COLONIES/mL Consistent with normal respiratory flora.   Report  Status 02/16/2016 FINAL  Final     Labs: BNP (last 3 results) No results for input(s): BNP in the last 8760 hours. Basic Metabolic Panel:  Recent Labs Lab 02/11/16 0517 02/13/16 1208 02/14/16 0623 02/16/16 0327  NA 142 139 138 142  K 3.9 3.7 3.4* 3.8  CL 112* 106 103 107  CO2 22 29 28 29   GLUCOSE 76 86 107* 107*  BUN 7 5* 7 8  CREATININE 0.81 0.87 0.95 1.12  CALCIUM 8.2* 9.0 8.7* 8.8*   Liver Function Tests:  Recent Labs Lab 02/10/16 0407 02/11/16 0517 02/14/16 0623  AST 27 25 32  ALT 26 22 24   ALKPHOS 65 49 55  BILITOT 1.7* 1.5* 1.0  PROT 6.4* 5.4* 5.4*  ALBUMIN 3.5 3.1* 3.0*   No results for input(s): LIPASE, AMYLASE in the last 168 hours. No results for input(s): AMMONIA in the last 168 hours. CBC:  Recent Labs Lab 02/10/16 0407 02/11/16 0517 02/12/16 0717 02/13/16 0432 02/14/16 0623 02/16/16 0327  WBC 7.0 5.1  --  5.2 5.4 6.4  HGB 9.6* 8.0* 8.5* 8.3* 8.7* 9.2*  HCT 31.3* 26.3* 28.9* 27.3* 28.6* 31.0*  MCV 70.2* 71.1*  --  71.3* 73.0* 74.5*  PLT 113* 99*  --  89* 89* 101*   Cardiac Enzymes: No results for input(s): CKTOTAL, CKMB, CKMBINDEX, TROPONINI in the last 168 hours. BNP: Invalid input(s): POCBNP CBG: No results for input(s): GLUCAP in the last 168 hours. D-Dimer No results for input(s): DDIMER in the last 72 hours. Hgb A1c No results for input(s): HGBA1C in the last 72 hours. Lipid Profile No results for input(s): CHOL, HDL, LDLCALC, TRIG, CHOLHDL, LDLDIRECT in the last 72 hours. Thyroid function studies No results for input(s): TSH, T4TOTAL, T3FREE, THYROIDAB in the last 72 hours.  Invalid input(s): FREET3 Anemia work up No results for input(s): VITAMINB12, FOLATE, FERRITIN, TIBC, IRON, RETICCTPCT in the last 72 hours. Urinalysis No results found for: COLORURINE, APPEARANCEUR, LABSPEC, PHURINE, GLUCOSEU, HGBUR, BILIRUBINUR, KETONESUR, PROTEINUR, UROBILINOGEN, NITRITE, LEUKOCYTESUR Sepsis Labs Invalid input(s): PROCALCITONIN,  WBC,   LACTICIDVEN Microbiology Recent Results (from the past 240 hour(s))  Culture, bal-quantitative     Status: Abnormal   Collection Time: 02/14/16  2:17 PM  Result Value Ref Range Status   Specimen Description BRONCHIAL ALVEOLAR LAVAGE  Final   Special Requests LU  Final   Gram Stain   Final    FEW WBC PRESENT, PREDOMINANTLY MONONUCLEAR FEW GRAM POSITIVE COCCI RARE GRAM NEGATIVE RODS    Culture (A)  Final    40,000 COLONIES/mL Consistent with normal respiratory flora.   Report Status 02/16/2016 FINAL  Final  Pneumocystis smear by DFA     Status: None   Collection Time: 02/14/16  2:17 PM  Result Value  Ref Range Status   Specimen Source-PJSRC BRONCHIAL ALVEOLAR LAVAGE  Final   Pneumocystis jiroveci Ag NEGATIVE  Final    Comment: Performed at [facility]  Acid Fast Smear (AFB)     Status: None   Collection Time: 02/14/16  2:17 PM  Result Value Ref Range Status   AFB Specimen Processing Concentration  Final   Acid Fast Smear Negative  Final    Comment: (NOTE) Performed At: [facility] [address] [place], [place] [id number] Mila Homer MD WU:[id number]    Source (AFB) BRONCHIAL ALVEOLAR LAVAGE  Corrected    Comment: CORRECTED ON 12/11 AT 1803: PREVIOUSLY REPORTED AS BLOOD  Culture, bal-quantitative     Status: Abnormal   Collection Time: 02/14/16  2:18 PM  Result Value Ref Range Status   Specimen Description BRONCHIAL ALVEOLAR LAVAGE  Final   Special Requests RL  Final   Gram Stain   Final    FEW WBC PRESENT, PREDOMINANTLY MONONUCLEAR FEW GRAM POSITIVE COCCI FEW GRAM NEGATIVE RODS RARE GRAM POSITIVE RODS    Culture (A)  Final    60,000 COLONIES/mL Consistent with normal respiratory flora.   Report Status 02/16/2016 FINAL  Final     Time coordinating discharge: Over 30 minutes  SIGNED:  Pamella Pert, MD  [facility] 02/16/2016, 4:13 PM Pager [phone]  If 7PM-7AM, please contact night-coverage [email or web address] Password  [email or web address]

## 2016-02-16 NOTE — Progress Notes (Signed)
Name: Leonard Fowler MRN: [id number] DOB: [birth year]    ADMISSION DATE:  02/08/2016 CONSULTATION DATE:  02/10/16  REFERRING MD :  Dr. Sunnie Nielsenegalado   CHIEF COMPLAINT:  SOB   HISTORY OF PRESENT ILLNESS:  [age] y/ M with PMH of appendectomy who [facility] after being sent from his PCP to the ER for evaluation of anemia, elevated D-Dimer, SOB and chest pain.    SUBJECTIVE:  Feels a little better.   VITAL SIGNS: Temp:  [98.2 F (36.8 C)-99 F (37.2 C)] 99 F (37.2 C) (12/13 0129) Pulse Rate:  [58-77] 76 (12/13 0129) Resp:  [18-19] 18 (12/13 0129) BP: (111-130)/(61-84) 111/64 (12/13 0129) SpO2:  [97 %-100 %] 97 % (12/13 0129) Room air  PHYSICAL EXAMINATION: General:  Well developed young adult male in NAD, supine in bed on RA Neuro:  AAOx4, speech clear, MAE  HEENT:  MM pink/moist, no jvd Cardiovascular:  s1s2 rrr, no m/r/g  Lungs:  Even/non-labored, lungs bilaterally clear  Abdomen:  Flat, soft, mild tenderness to palpation Musculoskeletal:  No acute deformities  Skin:  No rashes or lesions    Recent Labs Lab 02/13/16 1208 02/14/16 0623 02/16/16 0327  NA 139 138 142  K 3.7 3.4* 3.8  CL 106 103 107  CO2 29 28 29   BUN 5* 7 8  CREATININE 0.87 0.95 1.12  GLUCOSE 86 107* 107*     Recent Labs Lab 02/13/16 0432 02/14/16 0623 02/16/16 0327  HGB 8.3* 8.7* 9.2*  HCT 27.3* 28.6* 31.0*  WBC 5.2 5.4 6.4  PLT 89* 89* 101*    Dg Chest Port 1 View  Result Date: 02/15/2016 CLINICAL DATA:  Evaluate for chest wall discomfort since yesterday EXAM: PORTABLE CHEST 1 VIEW COMPARISON:  02/08/2016 FINDINGS: Heart is upper limits normal in size. Lungs are clear. No effusions or acute bony abnormality. IMPRESSION: No active disease. Electronically Signed   By: Charlett NoseKevin  Dover M.D.   On: 02/15/2016 11:45     SIGNIFICANT EVENTS  12/05  Admit with chest pain, exertional dyspnea, fatigue, low Hgb on outpatient labs  STUDIES:  CTA Chest 12/5 >> neg for PE, incidental  finding of multiple pulmonary nodules, largest measuring 8 mm   ASSESSMENT / PLAN:  Multiple Pulmonary Nodules -->likely infectious in origin  largest measuring 8 mm, differential dx includes fissural lymph nodes, fungal / bacterial infection, hamartoma / benign finding, malignancy.  -underwent FOB 12/11 -ID consulted & appreciate input.  Diagnostic results thus far: -HIV NR Cryptococcal antigen: negative Pneumocystis smear>>negative  AFB smear>>>negative   plan F/u the following:  Serum Histoplasma ABs>> Urine Histo antigen>>> Cytology >>>no malignant cells BAL:few gpc few gnr rare gpr>>> AFB culture >>> Fungal Antibody panel>> Serum Chlamydia panel>> Strongyloides antibody>> Cepacol for sore throat   Could probably be followed up in out pt setting once cleared from an ID stand-point. If all tests negative:we could attempt to target the largest L nodule with ENB. Will set him up with our NP on 28th. At that point the case can be reviewed further w/ Dr Delton CoombesByrum to discuss ENB  Simonne MartinetPeter E Babcock ACNP-BC [facility] Pulmonary/Critical Care Pager # [phone] OR # [phone] if no answer  Attending Note:  40 year old male with no PMH who [facility] with SOB and anemia. CT of the chest that I reviewed myself revealed 2 pulmonary nodules. On exam, patient is breathing comfortably with a clear lung exam. Discussed with PCCM-NP and ID-MD.  Atelectasis:             -  IS per RT protocol  Pulmonary nodules: - Cytology negative. - Appreciate input from ID, will continue to follow  ?Pulmonary infection:  - AFB from BAL is negative, can d/c isolation but will defer to ID  - Bacterial cultures negative  - Defer abx use to ID.  - F/U on labs above  Airway irritation: likely due to coughing and evidence of bronchorhea.             - Cough suppression PRN             - F/U with ID.  Hypoxemia: - Titrate O2 for sat of 88-92%. - May  need ambulatory desaturation prior to discharge for ? Of home O2.  PCCM will sign off, please call back if needed.  Patient seen and examined, agree with above note. I dictated the care and orders written for this patient under my direction.  Alyson ReedyWesam G Tyee Vandevoorde, MD [phone]

## 2016-02-16 NOTE — Care Management Note (Addendum)
Case Management Note  Patient Details  Name: Leonard Fowler MRN: [id number] Date of Birth: [birth year]  Subjective/Objective:                    Action/Plan: Pt discharging home with self care. Pt with insurance coverage for his medications. Spoke to patient with the assistance of his brother and he has [facility]. No further needs per CM.   Expected Discharge Date:                  Expected Discharge Plan:  Home/Self Care  In-House Referral:     Discharge planning Services  CM Consult  Post Acute Care Choice:    Choice offered to:     DME Arranged:    DME Agency:     HH Arranged:    HH Agency:     Status of Service:  Completed, signed off  If discussed at [employer] Length of Stay Meetings, dates discussed:    Additional Comments:  Kermit BaloKelli F Anndee Connett, RN 02/16/2016, 3:47 PM

## 2016-02-18 LAB — FUNGAL ANTIBODIES PANEL, ID-BLOOD
ASPERGILLUS FLAVUS: NEGATIVE
ASPERGILLUS FUMIGATUS IGG: NEGATIVE
Aspergillus niger: NEGATIVE
Blastomyces Abs, Qn, DID: NEGATIVE
HISTOPLASMA AB ID: NEGATIVE

## 2016-02-18 LAB — O&P RESULT

## 2016-02-18 LAB — OVA + PARASITE EXAM

## 2016-02-18 LAB — CHLAMYDIA PANEL SERUM
C. Pneumoniae IgG Serum: 1:64 {titer} — AB
C. Pneumoniae IgM Serum: <1:20 {titer}
C. Psittaci IgM Serum: <1:20 {titer}
C. Trachomatis IgG Serum: <1:64 {titer}

## 2016-03-02 ENCOUNTER — Encounter: Payer: Self-pay | Admitting: Acute Care

## 2016-03-02 ENCOUNTER — Ambulatory Visit (INDEPENDENT_AMBULATORY_CARE_PROVIDER_SITE_OTHER): Payer: BLUE CROSS/BLUE SHIELD | Admitting: Acute Care

## 2016-03-02 ENCOUNTER — Ambulatory Visit (INDEPENDENT_AMBULATORY_CARE_PROVIDER_SITE_OTHER)
Admission: RE | Admit: 2016-03-02 | Discharge: 2016-03-02 | Disposition: A | Discharge status: Home / Self Care | Payer: BLUE CROSS/BLUE SHIELD | Source: Ambulatory Visit | Attending: Acute Care | Admitting: Acute Care

## 2016-03-02 VITALS — BP 116/80 | HR 68 | Ht 66.0 in | Wt 140.2 lb

## 2016-03-02 DIAGNOSIS — R918 Other nonspecific abnormal finding of lung field: Secondary | ICD-10-CM

## 2016-03-02 MED ORDER — DOXYCYCLINE HYCLATE 100 MG PO TABS: 100.0000 mg | ORAL_TABLET | Freq: Two times a day (BID) | ORAL | 0 refills | Status: DC

## 2016-03-02 NOTE — Progress Notes (Signed)
History of Present Illness Leonard Fowler is a 40 y.o. male with PMH of appendectomy who presented to [facility] on 12/7 after being sent from his PCP to the ER for evaluation of anemia, elevated D-Dimer, SOB and chest pain.CCM saw the patient for evaluation of pulmonary nodules. He is here today for hospital follow up   03/02/2016 Hospital Follow UP: Pt. Presents today for follow up. He was admitted to the hospital 02/08/2016-02/16/2016 for evaluation of anemia, elevated d-dimer, SOB and chest pain.He  Seen by CCM for evaluation of pulmonary nodules, the largest measuring 8 mm. differential dx includes fissural lymph nodes, fungal / bacterial infection, hamartoma / benign finding, malignancy.  On 12/11 he underwent a bronchoscopy with the following diagnostic results:   Multiple Pulmonary Nodules:s/p bronchoscopy 12/11 with PCCM.  Multiple labs sent.  Gram stain from BAL with few GPC, few GNR, rare GP rods.  Culture with normal respiratory flora.  Hepatitis and HIV serologies negative.   - Cryptococcal antigen negative - Cytology with no malignant cells identified - Pneumocystis smear negative - Strongyloides antibody negative - AFB smear negative >>  D/c airborne precautions - AFB culture pending>> Negative - f/u histoplasma urine antigen>> pending - f/u Fungal antibody panel>> Negative - f/u Chlamydia panel>> C Pneumoniae IgG Serum 1:64 - f/u stool O&P>> No Ova , Cysyts or parasites seen - if above are unrevealing, patient will likely need biopsy which can presumably be done as outpatient - Consider home with doxy for 2 weeks due to risk of psittachiosis  Total days of antibiotics as inpatient: 0  02/11/2016: Normal Endoscopy ( Biopsy obtained) and Colonoscopy. HGB 9.2 at discharge Platelets 101,000 at discharge   Pt. States through interpreter that he  is compliant with his ferrous sulfate and his protonix. Over all he feels better. He does state that when he brushes his teeth he does  note some blood tinge in his sputum. No obvious signs of bleeding or bruising. He states he was not told to take doxycycline at discharge so he has not taken the  Doxycycline since discharge. There is a language barrier, and some anger that  his antibiotic was not at his pharmacy after discharge. Upon further investigation of the folder the patient had in his lap, I found the prescription for his Doxycycline which was given to him upon discharge from the hospital on 02/16/2016. I explained , through his interpreter, that he needed to take it to the pharmacy for it to be filled. He states that nobody told him to take the prescription to the pharmacy. He does not have a follow up appointment with either his PCP for anemia or with infectious disease. There is a degree of distrust with this patient regarding the care he received in the hospital.Plan today is to treat with 2 weeks worth of Doxycycline, as was prescribed at discharge. He understands that he needs to take this medication. He will follow up with Dr. Sherene SiresWert in  4 weeks for further evaluation  and  possible plan  for EBUS /  biopsy of largest pulmonary nodule if pending elements of work up remain unrevealing of cause.CXR today was unremarkable. He denies chest pain, orthopnea or hemoptysis. He does note some blood in his sputum when brushing his teeth.He denies cough.   Tests 03/02/2016: CXR FINDINGS: Cardiac silhouette is normal in size. No mediastinal or hilar masses. No evidence of adenopathy.  Past medical hx Past Medical History:  Diagnosis Date  . Medical history non-contributory  Past surgical hx, Family hx, Social hx all reviewed.  Current Outpatient Prescriptions on File Prior to Visit  Medication Sig  . acetaminophen (TYLENOL) 325 MG tablet Take 2 tablets (650 mg total) by mouth every 6 (six) hours as needed for mild pain (or Fever >/= 101).  Marland Kitchen. doxycycline (VIBRAMYCIN) 100 MG capsule Take 1 capsule (100 mg total) by mouth 2  (two) times daily.  . ferrous sulfate 325 (65 FE) MG tablet Take 1 tablet (325 mg total) by mouth 3 (three) times daily with meals.  . pantoprazole (PROTONIX) 40 MG tablet Take 1 tablet (40 mg total) by mouth daily.   No current facility-administered medications on file prior to visit.      No Known Allergies  Review Of Systems:  Constitutional:   No  weight loss, night sweats,  ? Fevers, no chills,+fatigue, or  lassitude.  HEENT:   No headaches,  Difficulty swallowing,  Tooth/dental problems, or  Sore throat,                No sneezing, itching, ear ache, nasal congestion, post nasal drip,   CV:  No chest pain,  Orthopnea, PND, swelling in lower extremities, anasarca, dizziness, palpitations, syncope.   GI  No heartburn, indigestion, abdominal pain, nausea, vomiting, diarrhea, change in bowel habits, loss of appetite, bloody stools.   Resp: No shortness of breath with exertion or at rest.  No excess mucus, no productive cough,  No non-productive cough,  No coughing up of blood.  No change in color of mucus.  No wheezing.  No chest wall deformity  Skin: no rash or lesions.  GU: no dysuria, change in color of urine, no urgency or frequency.  No flank pain, no hematuria   MS:  No joint pain or swelling.  No decreased range of motion.  No back pain.  Psych:  No change in mood or affect. No depression or anxiety.  No memory loss.   Vital Signs BP 116/80 (BP Location: Left Arm, Patient Position: Sitting, Cuff Size: Normal)   Pulse 68   Ht 5\' 6"  (1.676 m)   Wt 140 lb 3.2 oz (63.6 kg)   SpO2 100%   BMI 22.63 kg/m    Physical Exam:  General- No distress,  A&Ox3, anxious, does not speak [place] ENT: No sinus tenderness, TM clear, pale nasal mucosa, no oral exudate,no post nasal drip, no LAN Cardiac: S1, S2, regular rate and rhythm, no murmur Chest: No wheeze/ rales/ dullness; no accessory muscle use, no nasal flaring, no sternal retractions Abd.: Soft Non-tender Ext: No  clubbing cyanosis, edema Neuro:  normal strength Skin: No rashes, warm and dry Psych: normal mood and behavior  Anxious, and communicating through an interpreter.  Assessment/Plan  Pulmonary nodules Shortness of breath has improved since discharge Pt. Did not take prescribed Doxycycline despite having the hard copy prescription with him since D/C 12/13. Work up remains unrevealing of etiology. Plan: We will prescribe Doxycycline 100 mg twice daily for 14 days. Continue taking your other medications as prescribed. Please schedule a follow up appointment with your PCP. We will determine if it is necessary to refer to Infectious disease MD. Follow up in 4 weeks with Dr. Sherene SiresWert. Please contact office for sooner follow up if symptoms do not improve or worsen or seek emergency care      Bevelyn NgoSarah F Groce, NP 03/02/2016  3:26 PM

## 2016-03-02 NOTE — Assessment & Plan Note (Addendum)
Shortness of breath has improved since discharge Pt. Did not take prescribed Doxycycline despite having the hard copy prescription with him since D/C 12/13. Work up remains unrevealing of etiology. Plan: We will prescribe Doxycycline 100 mg twice daily for 14 days. Continue taking your other medications as prescribed. Please schedule a follow up appointment with your PCP. We will determine if it is necessary to refer to Infectious disease MD. Follow up in 4 weeks with Dr. Sherene SiresWert. Please contact office for sooner follow up if symptoms do not improve or worsen or seek emergency care

## 2016-03-02 NOTE — Progress Notes (Signed)
Chart and office note reviewed in detail along > agree with a/p as outlined  

## 2016-03-02 NOTE — Patient Instructions (Addendum)
We will get a CXR today. We will prescribe Doxycycline 100 mg twice daily for 14 days. Continue taking your other medications as prescribed. Please schedule a follow up appointment with your PCP. We will determine if it is necessary to refer to Infectious disease MD. Follow up in 4 weeks with Dr. Sherene SiresWert. Please contact office for sooner follow up if symptoms do not improve or worsen or seek emergency care

## 2016-03-29 LAB — ACID FAST CULTURE WITH REFLEXED SENSITIVITIES: ACID FAST CULTURE - AFSCU3: NEGATIVE

## 2016-03-30 ENCOUNTER — Ambulatory Visit (INDEPENDENT_AMBULATORY_CARE_PROVIDER_SITE_OTHER)
Admission: RE | Admit: 2016-03-30 | Discharge: 2016-03-30 | Disposition: A | Discharge status: Home / Self Care | Payer: BLUE CROSS/BLUE SHIELD | Source: Ambulatory Visit | Attending: Internal Medicine | Admitting: Internal Medicine

## 2016-03-30 ENCOUNTER — Ambulatory Visit (INDEPENDENT_AMBULATORY_CARE_PROVIDER_SITE_OTHER): Payer: BLUE CROSS/BLUE SHIELD | Admitting: Internal Medicine

## 2016-03-30 ENCOUNTER — Encounter: Payer: Self-pay | Admitting: Internal Medicine

## 2016-03-30 VITALS — BP 90/60 | HR 70 | Wt 148.0 lb

## 2016-03-30 DIAGNOSIS — D508 Other iron deficiency anemias: Secondary | ICD-10-CM | POA: Diagnosis not present

## 2016-03-30 DIAGNOSIS — R918 Other nonspecific abnormal finding of lung field: Secondary | ICD-10-CM

## 2016-03-30 NOTE — Assessment & Plan Note (Signed)
Hx in restrospect is not at all suggestive of an infectious pulmonary process    Although there are clearly abnormalities on CT scan, they should probably be considered "microscopic" since not obvious on plain cxr .     In the setting of obvious "macroscopic" health issues,  I am very reluctatnt to embark on further invasive w/u at this point but will arrange consevative  follow up  In 4 weeks with interpretor present to be sure we have a very clear line of communication going forward.  I had an extended discussion with the patient reviewing all relevant studies completed to date and  lasting 25 minutes of a 40  minute transition of care visit  Using my broken [name] and his broken English to sort out      non-specific but potentially very serious pulmonary symptoms/findings  of unknown etiology.  Each maintenance medication was reviewed in detail including most importantly the difference between maintenance and prns and under what circumstances the prns are to be triggered using an action plan format that is not reflected in the computer generated alphabetically organized AVS.    Please see AVS for specific instructions unique to this office visit that I personally wrote and verbalized to the the pt in detail and then reviewed with pt  by my nurse highlighting any  changes in therapy recommended at today's visit to their plan of care.

## 2016-03-30 NOTE — Progress Notes (Signed)
Spoke with pt and notified of results per Dr. Wert. Pt verbalized understanding and denied any questions. 

## 2016-03-30 NOTE — Assessment & Plan Note (Addendum)
Trending up >> F/u at next ov if not seen by [facility] to sort out cause but note EGD and colonoscopy neg

## 2016-03-30 NOTE — Patient Instructions (Addendum)
Please remember to go to the  x-ray department downstairs for your tests - we will call you with the results when they are available.     Return in 4 weeks with interpretor for your final visit to be sure you are staying completely better - call in meantime to move up the appointment if you start feeling worse    S'il vous plat n'oubliez pas d'aller au rayon X en bas pour vos tests - nous vous appellerons avec les rsultats quand ils sont disponibles.   Revenez dans 4 semaines avec un interprte pour votre dernire visite afin de vous assurer que vous restez compltement mieux - appelez entre-temps pour passer au rendez-vous si vous commencez  vous sentir mal.

## 2016-03-30 NOTE — Progress Notes (Signed)
.   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 > referred back to pulmonary office for hosp f/u 03/02/16 with neg w/u to date   History of Present Illness  03/02/2016 NP Follow UP:  He was admitted to the hospital 02/08/2016-02/16/2016 for evaluation of anemia, elevated d-dimer, SOB and chest pain.He  Seen by CCM for evaluation of pulmonary nodules, the largest measuring 8 mm. differential dx includes fissural lymph nodes, fungal / bacterial infection, hamartoma / benign finding, malignancy.   Multiple Pulmonary Nodules:s/p bronchoscopy 02/14/16  Multiple labs sent.  Gram stain from BAL with few GPC, few GNR, rare GP rods.  Culture with normal respiratory flora.  Hepatitis and HIV serologies negative.   - Cryptococcal antigen negative - Cytology with no malignant cells identified - Pneumocystis smear negative - Strongyloides antibody negative - AFB smear negative >>  D/c airborne precautions - AFB culture pending>> Negative - f/u histoplasma urine antigen>> pending - f/u Fungal antibody panel>> Negative - f/u Chlamydia panel>> C Pneumoniae IgG Serum 1:64 - f/u stool O&P>> No Ova , Cysts or parasites seen - if above are unrevealing, patient will likely need biopsy which can presumably be done as outpatient -   home with doxy for 2 weeks due to risk of psittachosis   Total days of antibiotics as inpatient: 0  02/11/2016: Normal Endoscopy ( Biopsy obtained) and Colonoscopy. HGB 9.2 at discharge Platelets 101,000 at discharge   Pt. States through interpreter that he  is compliant with his ferrous sulfate and his protonix. Over all he feels  better. He does state that when he brushes his teeth he does note some blood tinge in his sputum. No obvious signs of bleeding or bruising. He states he was not told to take doxycycline at discharge so he has not taken the  Doxycycline since discharge. There is a language barrier, and some anger that  his antibiotic was not at his pharmacy after discharge. Upon further investigation of the folder the patient had in his lap, I found the prescription for his Doxycycline which was given to him upon discharge from the hospital on 02/16/2016. I explained , through his interpreter, that he needed to take it to the pharmacy for it to be filled. He states that nobody told him to take the prescription to the pharmacy. He does not have a follow up appointment with either his PCP for anemia or with infectious disease. There is a degree of distrust with this patient regarding the care he received in the hospital.Plan today is to treat with 2 weeks worth of Doxycycline, as was prescribed at discharge. He understands that he needs to take this medication. He will follow up with Dr. Sherene Sires in  4 weeks for further evaluation  and  possible plan  for EBUS /  biopsy of largest pulmonary nodule if pending elements of work up remain unrevealing of cause.  rec We will get a CXR today. We will prescribe Doxycycline 100 mg twice daily for 14 days. Continue taking your other medications as prescribed. Please schedule a follow up appointment with your PCP.   03/30/2016  f/u ov/Wert  re: transition of care to outpt clinic / not clear at ll what meds he's really taking / taken to date  Chief Complaint  Patient presents with  . Follow-up    Pt states doing well and no co's today.    pt states 100% - no cp, no sob or cough or fever or sweats  "avez vous mal"  :  A  Rien de tout (nothing at all)   No obvious day to day or daytime variability or assoc excess/ purulent sputum or mucus plugs or hemoptysis or cp or chest tightness,  subjective wheeze or overt sinus or hb symptoms. No unusual exp hx or h/o childhood pna/ asthma or knowledge of premature birth.  Sleeping ok without nocturnal  or early am exacerbation  of respiratory  c/o's or need for noct saba. Also denies any obvious fluctuation of symptoms with weather or environmental changes or other aggravating or alleviating factors except as outlined above   Current Medications, Allergies, Complete Past Medical History, Past Surgical History, Family History, and Social History were [facility] electronic [facility].  ROS  The following are not active complaints unless bolded sore throat, dysphagia, dental problems, itching, sneezing,  nasal congestion or excess/ purulent secretions, ear ache,   fever, chills, sweats, unintended wt loss, classically pleuritic or exertional cp,  orthopnea pnd or leg swelling, presyncope, palpitations, abdominal pain, anorexia, nausea, vomiting, diarrhea  or change in bowel or bladder habits, change in stools or urine, dysuria,hematuria,  rash, arthralgias, visual complaints, headache, numbness, weakness or ataxia or problems with walking or coordination,  change in mood/affect or memory.               Physical Exam:  amb pleasant bm speaking broken english       General- No distress,  A&Ox3,   ENT: No sinus tenderness, TM clear, pale nasal mucosa, no oral exudate,no post nasal drip, no LAN Cardiac: S1, S2, regular rate and rhythm, no murmur Chest: No wheeze/ rales/ dullness; no accessory muscle use, no nasal flaring, no sternal retractions Abd.: Soft Non-tender Ext: No clubbing cyanosis, edema Neuro:  normal strength Skin: No rashes, warm and dry Psych: normal mood and behavior   CXR PA and Lateral:   03/30/2016 :    I personally reviewed images and agree with radiology impression as follows:    No acute cardiopulmonary disease.    labs reviewed: Lab Results  Component Value Date   HGB 9.2 (L)  02/16/2016   HGB 8.7 (L) 02/14/2016   HGB 8.3 (L) 02/13/2016       Lab Results  Component Value Date   ESRSEDRATE 10 02/10/2016   ACE      02/10/16  =  22    Assessment/Plan

## 2016-05-09 ENCOUNTER — Emergency Department (HOSPITAL_COMMUNITY): Payer: BLUE CROSS/BLUE SHIELD

## 2016-05-09 ENCOUNTER — Encounter (HOSPITAL_COMMUNITY): Payer: Self-pay

## 2016-05-09 ENCOUNTER — Emergency Department (HOSPITAL_COMMUNITY)
Admission: EM | Admit: 2016-05-09 | Discharge: 2016-05-09 | Disposition: A | Discharge status: Home / Self Care | Payer: BLUE CROSS/BLUE SHIELD | Attending: Emergency Medicine | Admitting: Emergency Medicine

## 2016-05-09 DIAGNOSIS — R05 Cough: Secondary | ICD-10-CM | POA: Diagnosis not present

## 2016-05-09 DIAGNOSIS — J069 Acute upper respiratory infection, unspecified: Secondary | ICD-10-CM | POA: Insufficient documentation

## 2016-05-09 DIAGNOSIS — R0789 Other chest pain: Secondary | ICD-10-CM

## 2016-05-09 DIAGNOSIS — B9789 Other viral agents as the cause of diseases classified elsewhere: Secondary | ICD-10-CM

## 2016-05-09 DIAGNOSIS — R071 Chest pain on breathing: Secondary | ICD-10-CM | POA: Diagnosis present

## 2016-05-09 LAB — CBC
HCT: 43.9 % (ref 39.0–52.0)
Hemoglobin: 13.8 g/dL (ref 13.0–17.0)
MCH: 25.3 pg — ABNORMAL LOW (ref 26.0–34.0)
MCHC: 31.4 g/dL (ref 30.0–36.0)
MCV: 80.6 fL (ref 78.0–100.0)
PLATELETS: 120 10*3/uL — AB (ref 150–400)
RBC: 5.45 MIL/uL (ref 4.22–5.81)
RDW: 16.9 % — AB (ref 11.5–15.5)
WBC: 6.1 10*3/uL (ref 4.0–10.5)

## 2016-05-09 LAB — BASIC METABOLIC PANEL
Anion gap: 7 (ref 5–15)
BUN: 8 mg/dL (ref 6–20)
CO2: 28 mmol/L (ref 22–32)
CREATININE: 0.88 mg/dL (ref 0.61–1.24)
Calcium: 9.4 mg/dL (ref 8.9–10.3)
Chloride: 105 mmol/L (ref 101–111)
GFR calc Af Amer: >60 mL/min (ref >60)
Glucose, Bld: 96 mg/dL (ref 65–99)
Potassium: 3.8 mmol/L (ref 3.5–5.1)
SODIUM: 140 mmol/L (ref 135–145)

## 2016-05-09 LAB — I-STAT TROPONIN, ED: Troponin i, poc: 0 ng/mL (ref 0.00–0.08)

## 2016-05-09 MED ORDER — PHENYLEPH-DOXYLAMINE-DM-APAP 5-6.25-10-325 MG/15ML PO LIQD: 15.0000 mL | Freq: Every day | ORAL | 0 refills | Status: AC

## 2016-05-09 NOTE — ED Notes (Signed)
ED Provider at bedside. 

## 2016-05-09 NOTE — ED Triage Notes (Signed)
Per Pt through [employer], Pt is coming from home with complaints of Chest pain. Pt reports having congestion for the last few days and taking medication to help. Today, pt started to have dry cough and chest pain. Denied any SOB.

## 2016-05-09 NOTE — ED Notes (Signed)
[employer] interpreter used to explain delay to pt. Pt verbalized understanding.

## 2016-05-09 NOTE — ED Notes (Addendum)
Pt first language is [name], assessment completed with [employer] interpreter. Pt reports has had a cough for the past few days but when he woke up this morning he began experiencing sharp central chest pain when he coughs. Pt also reports L ear pain and a sore troat with dry cough. Pt also states he has had yellow nasal congestion. Denies N/V/D, fevers, or chills.

## 2016-05-09 NOTE — ED Provider Notes (Signed)
[facility]-EMERG[facility]Y DEPT Provider Note   CSN: [id number] Arrival date & time: 05/09/16 1342     History    Chief Complaint  Patient presents with  . Chest Pain     HPI Leonard Fowler is a 41 y.o. male.  41yo M w/ PMH below including anemia, pulmonary nodules who p/w chest pain and cough. History obtained with the assistance of the language line interpreter. Pt reports 3 days of cough associated with nasal congestion, left ear pain, and sore throat. Today, he began having central chest pain with his cough. The pain is not associated with exertion. He denies any associated shortness of breath. No fevers, vomiting, or diarrhea. No family history of heart problems. No recent travel, leg swelling, or history of blood clots.  Past Medical History:  Diagnosis Date  . Medical history non-contributory      Patient Active Problem List   Diagnosis Date Noted  . Atelectasis   . Hypoxemia   . Pulmonary nodules   . Symptomatic anemia 02/08/2016  . Iron deficiency 02/08/2016  . Thrombocytopenia (HCC) 02/08/2016  . Chest pain 02/08/2016  . Incidental pulmonary nodule 02/08/2016  . Anemia 02/08/2016    Past Surgical History:  Procedure Laterality Date  . APPENDECTOMY  1990s  . COLONOSCOPY N/A 02/11/2016   Procedure: COLONOSCOPY;  Surgeon: [name], MD;  Location: [facility] ENDOSCOPY;  Service: Endoscopy;  Laterality: N/A;  . ESOPHAGOGASTRODUODENOSCOPY N/A 02/11/2016   Procedure: ESOPHAGOGASTRODUODENOSCOPY (EGD);  Surgeon: [name], MD;  Location: [facility] ENDOSCOPY;  Service: Endoscopy;  Laterality: N/A;  . VIDEO BRONCHOSCOPY Bilateral 02/14/2016   Procedure: VIDEO BRONCHOSCOPY WITHOUT FLUORO;  Surgeon: Alyson Reedy, MD;  Location: [facility] ENDOSCOPY;  Service: Cardiopulmonary;  Laterality: Bilateral;        Home Medications    Prior to Admission medications   Medication Sig Start Date End Date Taking? Authorizing Provider  ferrous sulfate 325 (65 FE) MG tablet Take 1 tablet (325 mg  total) by mouth 3 (three) times daily with meals. 02/12/16  Yes Belkys A Regalado, MD  phenylephrine (NEO-SYNEPHRINE) 1 % nasal spray Place 1 drop into both nostrils every 6 (six) hours as needed for congestion.   Yes Historical Provider, MD  Pseudoephedrine-APAP-DM (TYLENOL COLD/FLU SEVERE DAY PO) Take 30 mLs by mouth daily as needed. For cold and flu symptoms   Yes Historical Provider, MD  acetaminophen (TYLENOL) 325 MG tablet Take 2 tablets (650 mg total) by mouth every 6 (six) hours as needed for mild pain (or Fever >/= 101). Patient not taking: Reported on 05/09/2016 02/12/16   Belkys A Regalado, MD  pantoprazole (PROTONIX) 40 MG tablet Take 1 tablet (40 mg total) by mouth daily. Patient not taking: Reported on 03/30/2016 02/12/16   Belkys A Regalado, MD  Phenyleph-Doxylamine-DM-APAP (NYQUIL SEVERE COLD/FLU) 5-6.25-10-325 MG/15ML LIQD Take 15 mLs by mouth at bedtime. As needed for cough 05/09/16   Laurence Spates, MD      Family History  Problem Relation Age of Onset  . Anemia Neg Hx   . Cancer Neg Hx      Social History  Substance Use Topics  . Smoking status: Never Smoker  . Smokeless tobacco: Never Used  . Alcohol use No     Allergies     Patient has no known allergies.    Review of Systems  10 Systems reviewed and are negative for acute change except as noted in the HPI.   Physical Exam Updated Vital Signs BP 108/77   Pulse  73   Temp 98.1 F (36.7 C) (Oral)   Resp 20   SpO2 98%   Physical Exam  Constitutional: He is oriented to person, place, and time. He appears well-developed and well-nourished. No distress.  HENT:  Head: Normocephalic and atraumatic.  Right Ear: Tympanic membrane and ear canal normal.  Left Ear: Tympanic membrane and ear canal normal.  Mouth/Throat: Oropharynx is clear and moist. No oropharyngeal exudate.  Moist mucous membranes  Eyes: Conjunctivae are normal. Pupils are equal, round, and reactive to light.  Neck: Neck supple.    Cardiovascular: Normal rate, regular rhythm and normal heart sounds.   No murmur heard. Pulmonary/Chest: Effort normal and breath sounds normal.  Abdominal: Soft. Bowel sounds are normal. He exhibits no distension. There is no tenderness.  Musculoskeletal: He exhibits no edema.  Neurological: He is alert and oriented to person, place, and time.  Fluent speech  Skin: Skin is warm and dry.  Psychiatric: He has a normal mood and affect. Judgment normal.  Nursing note and vitals reviewed.     ED Treatments / Results  Labs (all labs ordered are listed, but only abnormal results are displayed) Labs Reviewed  CBC - Abnormal; Notable for the following:       Result Value   MCH 25.3 (*)    RDW 16.9 (*)    Platelets 120 (*)    All other components within normal limits  BASIC METABOLIC PANEL  I-STAT TROPOININ, ED     EKG  EKG Interpretation  Date/Time:  Tuesday May 09 2016 13:56:49 EST Ventricular Rate:  73 PR Interval:  166 QRS Duration: 98 QT Interval:  372 QTC Calculation: 409 R Axis:   132 Text Interpretation:  Normal sinus rhythm Right axis deviation Possible Anterior infarct , age undetermined Abnormal ECG No significant change since last tracing Confirmed by Markevius Trombetta MD, Donnald Tabar [phone]) on 05/09/2016 6:22:45 PM         Radiology Dg Chest 2 View  Result Date: 05/09/2016 CLINICAL DATA:  Chest pain EXAM: CHEST  2 VIEW COMPARISON:  03/30/2016 chest radiograph. FINDINGS: Stable cardiomediastinal silhouette with normal heart size. No pneumothorax. No pleural effusion. Lungs appear clear, with no acute consolidative airspace disease and no pulmonary edema. IMPRESSION: No active cardiopulmonary disease. Electronically Signed   By: Delbert Phenix M.D.   On: 05/09/2016 14:50    Procedures Procedures (including critical care time) Procedures  Medications Ordered in ED  Medications - No data to display   Initial Impression / Assessment and Plan / ED Course  I have reviewed  the triage vital signs and the nursing notes.  Pertinent labs & imaging results that were available during my care of the patient were reviewed by me and considered in my medical decision making (see chart for details).     Patient with several days of cough/cold symptoms and now chest pain associated with the cough. He was comfortable and well-appearing on exam with normal vital signs, normal O2 saturation on room air and clear breath sounds. EKG unremarkable. His lab work shows reassuring CBC and BMP, platelets slightly low similar to his previous lab work. His anemia on previous labs has resolved. His troponin here is normal. Chest x-ray negative for infiltrate or other acute process. Given that his symptoms are related to the cough and are in the setting of viral illness, I feel ACS is unlikely. He also denies any chest pain currently. No risk factors for PE. I have discussed supportive care for his viral symptoms  and instructed him to follow-up with his PCP. I have extensively reviewed return precautions with him using interpreter, including worsening pain, breathing problems, or fever. Patient voiced understanding and was discharged in satisfactory condition.  Final Clinical Impressions(s) / ED Diagnoses   Final diagnoses:  Atypical chest pain  Viral URI with cough     Discharge Medication List as of 05/09/2016  7:03 PM    START taking these medications   Details  Phenyleph-Doxylamine-DM-APAP (NYQUIL SEVERE COLD/FLU) 5-6.25-10-325 MG/15ML LIQD Take 15 mLs by mouth at bedtime. As needed for cough, Starting Tue 05/09/2016, Print           Laurence Spatesachel Morgan Merrel Crabbe, MD 05/10/16 [phone]

## 2016-11-30 ENCOUNTER — Emergency Department (HOSPITAL_COMMUNITY): Payer: BLUE CROSS/BLUE SHIELD

## 2016-11-30 ENCOUNTER — Encounter (HOSPITAL_COMMUNITY): Payer: Self-pay

## 2016-11-30 ENCOUNTER — Emergency Department (HOSPITAL_COMMUNITY)
Admission: EM | Admit: 2016-11-30 | Discharge: 2016-11-30 | Disposition: A | Discharge status: Home / Self Care | Payer: BLUE CROSS/BLUE SHIELD | Attending: Emergency Medicine | Admitting: Emergency Medicine

## 2016-11-30 DIAGNOSIS — R1084 Generalized abdominal pain: Secondary | ICD-10-CM

## 2016-11-30 DIAGNOSIS — Z79899 Other long term (current) drug therapy: Secondary | ICD-10-CM | POA: Insufficient documentation

## 2016-11-30 LAB — URINALYSIS, ROUTINE W REFLEX MICROSCOPIC
Bilirubin Urine: NEGATIVE
GLUCOSE, UA: NEGATIVE mg/dL
HGB URINE DIPSTICK: NEGATIVE
KETONES UR: NEGATIVE mg/dL
LEUKOCYTES UA: NEGATIVE
Nitrite: NEGATIVE
PH: 5 (ref 5.0–8.0)
Protein, ur: NEGATIVE mg/dL
Specific Gravity, Urine: 1.024 (ref 1.005–1.030)

## 2016-11-30 LAB — COMPREHENSIVE METABOLIC PANEL
ALK PHOS: 58 U/L (ref 38–126)
ALT: 24 U/L (ref 17–63)
ANION GAP: 7 (ref 5–15)
AST: 33 U/L (ref 15–41)
Albumin: 3.9 g/dL (ref 3.5–5.0)
BILIRUBIN TOTAL: 2.1 mg/dL — AB (ref 0.3–1.2)
BUN: 12 mg/dL (ref 6–20)
CALCIUM: 9.1 mg/dL (ref 8.9–10.3)
CO2: 25 mmol/L (ref 22–32)
Chloride: 103 mmol/L (ref 101–111)
Creatinine, Ser: 0.83 mg/dL (ref 0.61–1.24)
Glucose, Bld: 100 mg/dL — ABNORMAL HIGH (ref 65–99)
Potassium: 3.8 mmol/L (ref 3.5–5.1)
SODIUM: 135 mmol/L (ref 135–145)
TOTAL PROTEIN: 7.1 g/dL (ref 6.5–8.1)

## 2016-11-30 LAB — CBC
HCT: 42.6 % (ref 39.0–52.0)
HEMOGLOBIN: 13.7 g/dL (ref 13.0–17.0)
MCH: 26.2 pg (ref 26.0–34.0)
MCHC: 32.2 g/dL (ref 30.0–36.0)
MCV: 81.5 fL (ref 78.0–100.0)
Platelets: 147 10*3/uL — ABNORMAL LOW (ref 150–400)
RBC: 5.23 MIL/uL (ref 4.22–5.81)
RDW: 13.9 % (ref 11.5–15.5)
WBC: 6.7 10*3/uL (ref 4.0–10.5)

## 2016-11-30 LAB — LIPASE, BLOOD: Lipase: 31 U/L (ref 11–51)

## 2016-11-30 MED ORDER — OXYCODONE-ACETAMINOPHEN 5-325 MG PO TABS
1.0000 | ORAL_TABLET | Freq: Once | ORAL | Status: AC
  Administered 2016-11-30: 1 via ORAL
  Filled 2016-11-30: qty 1

## 2016-11-30 MED ORDER — GI COCKTAIL ~~LOC~~
30.0000 mL | Freq: Once | ORAL | Status: AC
  Administered 2016-11-30: 30 mL via ORAL
  Filled 2016-11-30: qty 30

## 2016-11-30 MED ORDER — OMEPRAZOLE 20 MG PO CPDR: 20.0000 mg | DELAYED_RELEASE_CAPSULE | Freq: Every day | ORAL | 0 refills | Status: AC

## 2016-11-30 MED ORDER — NAPROXEN 500 MG PO TABS: 500.0000 mg | ORAL_TABLET | Freq: Two times a day (BID) | ORAL | 0 refills | Status: AC

## 2016-11-30 MED ORDER — DICYCLOMINE HCL 20 MG PO TABS: 20.0000 mg | ORAL_TABLET | Freq: Two times a day (BID) | ORAL | 0 refills | Status: AC

## 2016-11-30 NOTE — ED Notes (Signed)
Patient transported to X-ray 

## 2016-11-30 NOTE — Discharge Instructions (Signed)
Please read and follow all provided instructions.  Your diagnoses today include:  1. Generalized abdominal pain     Tests performed today include:  Blood counts and electrolytes  Blood tests to check liver and kidney function  Blood tests to check pancreas function  Urine test to look for infection  X-ray - that does not show any blockages  Vital signs. See below for your results today.   Medications prescribed:   Bentyl - medication for intestinal cramps and spasms   Naproxen - anti-inflammatory pain medication  Do not exceed  naproxen every 12 hours, take with food  You have been prescribed an anti-inflammatory medication or NSAID. Take with food. Take smallest effective dose for the shortest duration needed for your pain. Stop taking if you experience stomach pain or vomiting.    Omeprazole (Prilosec) - stomach acid reducer  This medication can be found over-the-counter  Take any prescribed medications only as directed.  Home care instructions:   Follow any educational materials contained in this packet.  Follow-up instructions: Please follow-up with your primary care provider in the next 3 days for further evaluation of your symptoms.    Return instructions:  SEEK IMMEDIATE MEDICAL ATTENTION IF:  The pain does not go away or becomes severe   A temperature above 101F develops   Repeated vomiting occurs (multiple episodes)   The pain becomes localized to portions of the abdomen. The right side could possibly be appendicitis. In an adult, the left lower portion of the abdomen could be colitis or diverticulitis.   Blood is being passed in stools or vomit (bright red or black tarry stools)   You develop chest pain, difficulty breathing, dizziness or fainting, or become confused, poorly responsive, or inconsolable (young children)  If you have any other emergent concerns regarding your health  Additional Information: Abdominal (belly) pain can be  caused by many things. Your caregiver performed an examination and possibly ordered blood/urine tests and imaging (CT scan, x-rays, ultrasound). Many cases can be observed and treated at home after initial evaluation in the emergency department. Even though you are being discharged home, abdominal pain can be unpredictable. Therefore, you need a repeated exam if your pain does not resolve, returns, or worsens. Most patients with abdominal pain don't have to be admitted to the hospital or have surgery, but serious problems like appendicitis and gallbladder attacks can start out as nonspecific pain. Many abdominal conditions cannot be diagnosed in one visit, so follow-up evaluations are very important.  Your vital signs today were: BP 111/81 (BP Location: Right Arm)    Pulse 77    Temp 98.2 F (36.8 C) (Oral)    Resp 16    Ht  (1.702 m)    Wt 64.9 kg (143 lb)    SpO2 100%    BMI 22.40 kg/m  If your blood pressure (bp) was elevated above 135/85 this visit, please have this repeated by your doctor within one month. --------------

## 2016-11-30 NOTE — ED Notes (Signed)
PT states understanding of care given, follow up care, and medication prescribed. PT ambulated from ED to car with a steady gait. 

## 2016-11-30 NOTE — ED Provider Notes (Signed)
[facility]EM[facility] DEPT Provider Note   CSN: [id number] Arrival date & time: 11/30/16  1618     History   Chief Complaint Chief Complaint  Patient presents with  . Abdominal Pain    HPI Leonard Fowler is a 41 y.o. male.  Patient has been living in [place] from the [place] for approximately 2 years, history of pulmonary nodules, history of follow-up with pulmonologist last December -- presents with 3 days of abdominal pain. Patient points to the lower chest and all across his entire abdomen when asked for the pain is located. However, pain seems to be the worst in the epigastrium. Patient does not have associated fevers, nausea, vomiting, diarrhea. No urinary symptoms. Patient has been taking acetaminophen prior to arrival. He denies other medications. He denies heavy NSAID or alcohol use. No difficulty breathing. No lower extremity swelling.  Patient was seen last December for abdominal pain. He had a CT exam at that time which was negative.      Past Medical History:  Diagnosis Date  . Medical history non-contributory     Patient Active Problem List   Diagnosis Date Noted  . Atelectasis   . Hypoxemia   . Pulmonary nodules   . Symptomatic anemia 02/08/2016  . Iron deficiency 02/08/2016  . Thrombocytopenia (HCC) 02/08/2016  . Chest pain 02/08/2016  . Incidental pulmonary nodule 02/08/2016  . Anemia 02/08/2016    Past Surgical History:  Procedure Laterality Date  . APPENDECTOMY  1990s  . COLONOSCOPY N/A 02/11/2016   Procedure: COLONOSCOPY;  Surgeon: [name], MD;  Location[facility] ENDOSCOPY;  Service: Endoscopy;  Laterality: N/A;  . ESOPHAGOGASTRODUODENOSCOPY N/A 02/11/2016   Procedure: ESOPHAGOGASTRODUODENOSCOPY (EGD);  Surgeon: [name], MD;  Location[facility] ENDOSCOPY;  Service: Endoscopy;  Laterality: N/A;  . VIDEO BRONCHOSCOPY Bilateral 02/14/2016   Procedure: VIDEO BRONCHOSCOPY WITHOUT FLUORO;  Surgeon: Alyson Reedy, MD;  Location: [facility] ENDOSCOPY;   Service: Cardiopulmonary;  Laterality: Bilateral;       Home Medications    Prior to Admission medications   Medication Sig Start Date End Date Taking? Authorizing Provider  acetaminophen (TYLENOL) 325 MG tablet Take 2 tablets (650 mg total) by mouth every 6 (six) hours as needed for mild pain (or Fever >/= 101). Patient not taking: Reported on 05/09/2016 02/12/16   Regalado, Jon Billings A, MD  ferrous sulfate 325 (65 FE) MG tablet Take 1 tablet (325 mg total) by mouth 3 (three) times daily with meals. 02/12/16   Regalado, Belkys A, MD  pantoprazole (PROTONIX) 40 MG tablet Take 1 tablet (40 mg total) by mouth daily. Patient not taking: Reported on 03/30/2016 02/12/16   Regalado, Jon Billings A, MD  Phenyleph-Doxylamine-DM-APAP (NYQUIL SEVERE COLD/FLU) 5-6.25-10-325 MG/15ML LIQD Take 15 mLs by mouth at bedtime. As needed for cough 05/09/16   Little, Ambrose Finland, MD  phenylephrine (NEO-SYNEPHRINE) 1 % nasal spray Place 1 drop into both nostrils every 6 (six) hours as needed for congestion.    [provider]  Pseudoephedrine-APAP-DM (TYLENOL COLD/FLU SEVERE DAY PO) Take 30 mLs by mouth daily as needed. For cold and flu symptoms    [provider]    Family History Family History  Problem Relation Age of Onset  . Anemia Neg Hx   . Cancer Neg Hx     Social History Social History  Substance Use Topics  . Smoking status: Never Smoker  . Smokeless tobacco: Never Used  . Alcohol use No     Allergies   Patient has no  known allergies.   Review of Systems Review of Systems  Constitutional: Negative for fever.  HENT: Negative for rhinorrhea and sore throat.   Eyes: Negative for redness.  Respiratory: Negative for cough.   Cardiovascular: Negative for chest pain.  Gastrointestinal: Positive for abdominal pain. Negative for constipation, diarrhea, nausea and vomiting.  Genitourinary: Negative for dysuria.  Musculoskeletal: Negative for myalgias.  Skin: Negative for rash.    Neurological: Negative for headaches.     Physical Exam Updated Vital Signs BP 111/81 (BP Location: Right Arm)   Pulse 77   Temp 98.2 F (36.8 C) (Oral)   Resp 16   Ht  (1.702 m)   Wt 64.9 kg (143 lb)   SpO2 100%   BMI 22.40 kg/m   Physical Exam  Constitutional: He appears well-developed and well-nourished.  HENT:  Head: Normocephalic and atraumatic.  Eyes: Conjunctivae are normal. Right eye exhibits no discharge. Left eye exhibits no discharge.  Neck: Normal range of motion. Neck supple.  Cardiovascular: Normal rate, regular rhythm and normal heart sounds.   Pulmonary/Chest: Effort normal and breath sounds normal.  Abdominal: Soft. There is tenderness (Minimal tenderness on exam, no focality.). There is no rebound and no guarding.  Musculoskeletal:  Patient is able to get up from the bed and walk with minimal discomfort. He does hold the area in the epigastrium with his hand when he walks.  Neurological: He is alert.  Skin: Skin is warm and dry.  Psychiatric: He has a normal mood and affect.  Nursing note and vitals reviewed.    ED Treatments / Results  Labs (all labs ordered are listed, but only abnormal results are displayed) Labs Reviewed  COMPREHENSIVE METABOLIC PANEL - Abnormal; Notable for the following:       Result Value   Glucose, Bld 100 (*)    Total Bilirubin 2.1 (*)    All other components within normal limits  CBC - Abnormal; Notable for the following:    Platelets 147 (*)    All other components within normal limits  LIPASE, BLOOD  URINALYSIS, ROUTINE W REFLEX MICROSCOPIC    EKG  EKG Interpretation None       Radiology Dg Abd Acute W/chest  Result Date: 11/30/2016 CLINICAL DATA:  Abdominal and lower chest pain EXAM: DG ABDOMEN ACUTE W/ 1V CHEST COMPARISON:  05/09/2016 FINDINGS: Single-view chest demonstrates no acute consolidation or effusion. Normal heart size. Supine and upright views of the abdomen demonstrate no free air beneath  the diaphragm. Mild gaseous enlargement of central small bowel up to 3 cm with possible mild bowel wall thickening. Gas is present in the colon. Moderate stool in the right colon. No abnormal calcification IMPRESSION: 1. Single-view chest within normal limits 2. Slight gaseous enlargement of small bowel in the left central abdomen with fluid level and possible mild bowel wall thickening, could be due to enteritis Electronically Signed   By: Jasmine Pang M.D.   On: 11/30/2016 21:09    Procedures Procedures (including critical care time)  Medications Ordered in ED Medications  gi cocktail (Maalox,Lidocaine,Donnatal) (30 mLs Oral Given 11/30/16 1924)  oxyCODONE-acetaminophen (PERCOCET/ROXICET) 5-325 MG per tablet 1 tablet (1 tablet Oral Given 11/30/16 2045)     Initial Impression / Assessment and Plan / ED Course  I have reviewed the triage vital signs and the nursing notes.  Pertinent labs & imaging results that were available during my care of the patient were reviewed by me and considered in my medical decision making (see  chart for details).     Patient seen and examined. Work-up reviewed and is reassuring. Medications ordered to treat symptoms.  Vital signs reviewed and are as follows: BP 111/81 (BP Location: Right Arm)   Pulse 77   Temp 98.2 F (36.8 C) (Oral)   Resp 16   Ht  (1.702 m)   Wt 64.9 kg (143 lb)   SpO2 100%   BMI 22.40 kg/m   8:17 PM Pt still in pain. Oral pain medication ordered.   9:13 PM X-ray reviewed. Shows possible enteritis. Patient does not clinically have an obstruction and the x-ray is not suggestive of this.  On reexam, abdomen is soft with mild generalized tenderness.  Patient updated on all results, including the x-ray. I told him that I do not have an exact reason for his pain today. However, lab work and imaging are reassuring. I told him that we would treat his symptoms with omeprazole, Bentyl for spasms and naproxen for pain. He is given  strict return instructions.  The patient was urged to return to the Emergency Department immediately with worsening of current symptoms, worsening abdominal pain, persistent vomiting, blood noted in stools, fever, or any other concerns. The patient verbalized understanding.    Final Clinical Impressions(s) / ED Diagnoses   Final diagnoses:  Generalized abdominal pain   Patient with abdominal pain and lower chest pain. Vitals are stable, no fever. Labs are normal And reassuring. Imaging is suggestive of enteritis. No free air noted and patient does not have any rebound or guarding to suggest peritonitis. Patient does not have vomiting or constipation to suggest obstruction. No signs of dehydration, patient is tolerating PO's. Lungs are clear and no signs suggestive of PNA. Low concern for appendicitis, cholecystitis, pancreatitis, ruptured viscus, UTI, kidney stone, aortic dissection, aortic aneurysm or other emergent abdominal etiology. Supportive therapy indicated with return if symptoms worsen.    New Prescriptions New Prescriptions   DICYCLOMINE (BENTYL) 20 MG TABLET    Take 1 tablet (20 mg total) by mouth 2 (two) times daily.   NAPROXEN (NAPROSYN) 500 MG TABLET    Take 1 tablet (500 mg total) by mouth 2 (two) times daily.   OMEPRAZOLE (PRILOSEC) 20 MG CAPSULE    Take 1 capsule (20 mg total) by mouth daily.     Renne Crigler, PA-C 11/30/16 2120    Pricilla Loveless, MD 12/02/16 [phone]

## 2016-11-30 NOTE — ED Triage Notes (Signed)
Pt presents with 3 day h/o generalized abdominal pain.  Pt denies any nausea, vomiting or diarrhea.

## 2017-10-16 IMAGING — DX DG CHEST 2V
2 series · 2 of 2 positions shown · non-contrast
Comparison: 03/30/2016 chest radiograph.

CLINICAL DATA: Chest pain

EXAM:
CHEST  2 VIEW

[w chest pa]
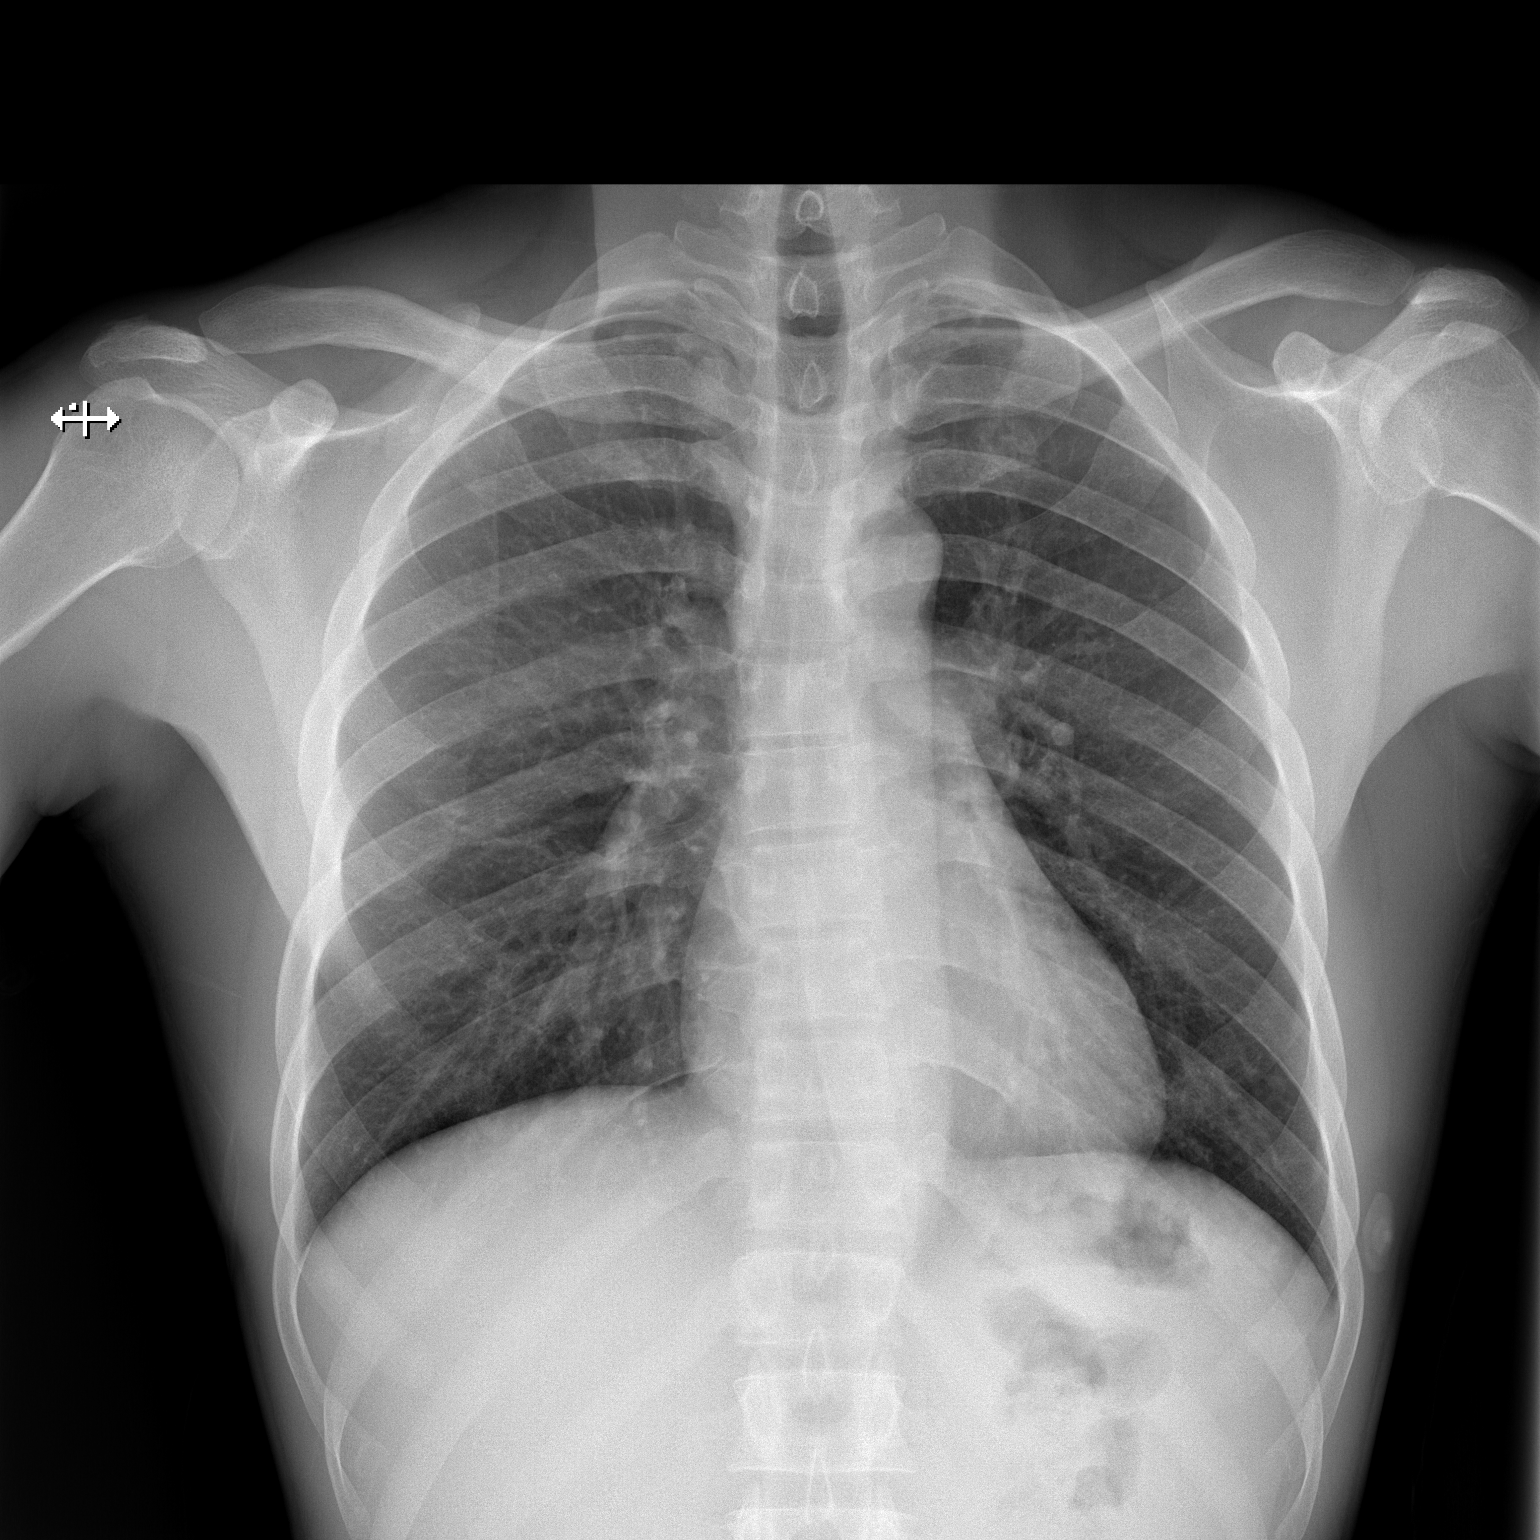

[w chest lat]
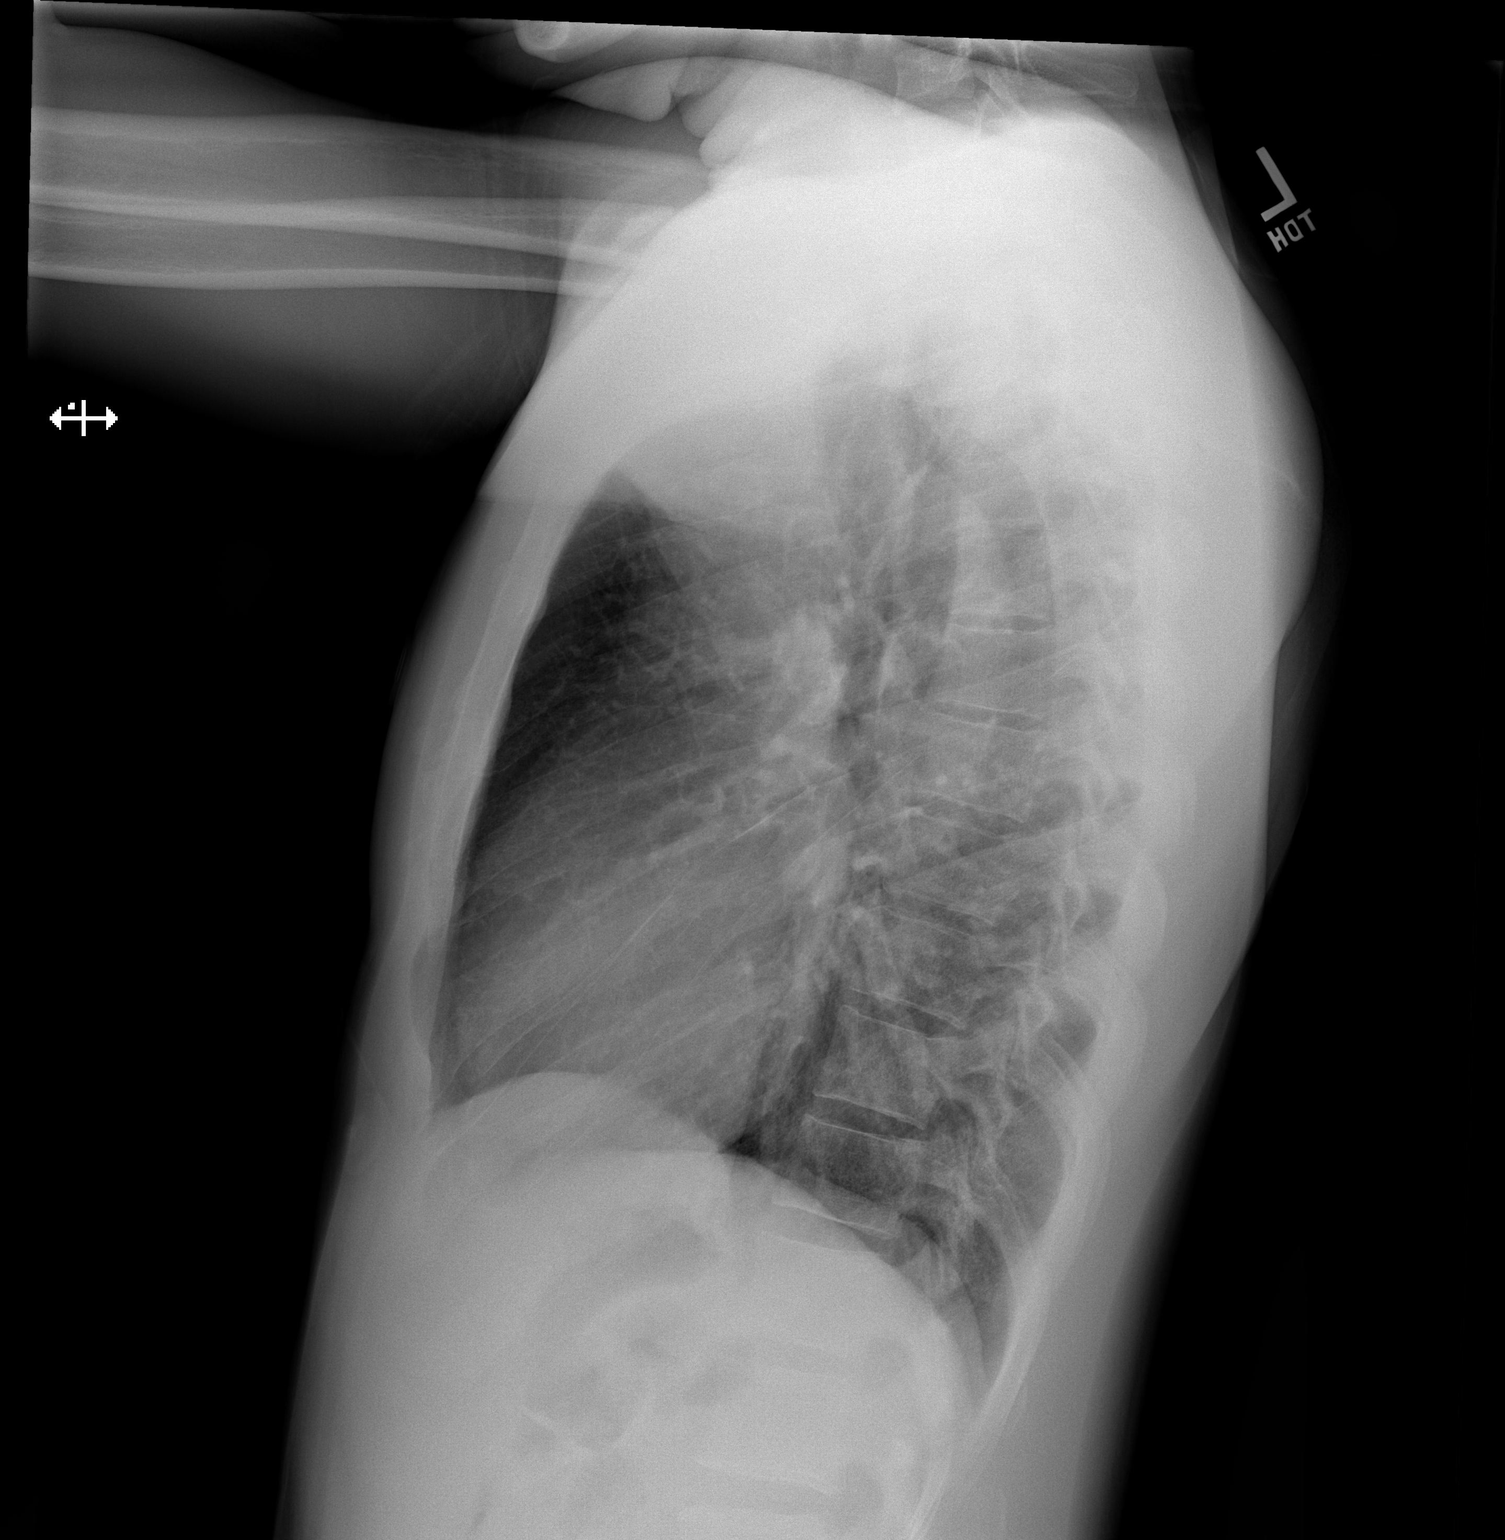

[2 of 2 positions shown; findings below may reference images not displayed]

FINDINGS: Stable cardiomediastinal silhouette with normal heart size. No
pneumothorax. No pleural effusion. Lungs appear clear, with no acute
consolidative airspace disease and no pulmonary edema.
IMPRESSION: No active cardiopulmonary disease.

## 2021-01-15 ENCOUNTER — Other Ambulatory Visit: Payer: Self-pay

## 2021-01-15 ENCOUNTER — Encounter (HOSPITAL_COMMUNITY): Payer: Self-pay | Admitting: Emergency Medicine

## 2021-01-15 ENCOUNTER — Emergency Department (HOSPITAL_COMMUNITY)
Admission: EM | Admit: 2021-01-15 | Discharge: 2021-01-15 | Disposition: A | Discharge status: Home / Self Care | Payer: BLUE CROSS/BLUE SHIELD | Attending: Emergency Medicine | Admitting: Emergency Medicine

## 2021-01-15 DIAGNOSIS — H9211 Otorrhea, right ear: Secondary | ICD-10-CM | POA: Insufficient documentation

## 2021-01-15 DIAGNOSIS — H9201 Otalgia, right ear: Secondary | ICD-10-CM | POA: Insufficient documentation

## 2021-01-15 DIAGNOSIS — H60501 Unspecified acute noninfective otitis externa, right ear: Secondary | ICD-10-CM

## 2021-01-15 MED ORDER — HEPARIN SODIUM (PORCINE) 5000 UNIT/ML IJ SOLN: 4000.0000 [IU] | Freq: Once | INTRAMUSCULAR | Status: DC

## 2021-01-15 MED ORDER — CIPROFLOXACIN-DEXAMETHASONE 0.3-0.1 % OT SUSP: 4.0000 [drp] | Freq: Two times a day (BID) | OTIC | 0 refills | Status: AC

## 2021-01-15 NOTE — ED Provider Notes (Signed)
[facility] EMERGENCY DEPARTMENT Provider Note   CSN: [id number] Arrival date & time: 01/15/21  1305     History Chief Complaint  Patient presents with   Ear Pain   Interpreter offered, patient declined. History provided by the patient in english.   Leonard Fowler is a 45 y.o. male who presents with 1 week of right ear pain, drainage, and muffling of his hearing after getting water stuck in his ear in the shower.  Denies fevers, chills, nausea, vomiting, sore throat, or pain in the left ear.  No history of the same. He has not sought medical evaluation for this prior to today.  I personally reviewed this patient's medical records.  Denies history of pulmonary nodules, symptomatic anemia with iron deficiency, and thrombocytopenia.   HPI     Past Medical History:  Diagnosis Date   Medical history non-contributory     Patient Active Problem List   Diagnosis Date Noted   Atelectasis    Hypoxemia    Pulmonary nodules    Symptomatic anemia 02/08/2016   Iron deficiency 02/08/2016   Thrombocytopenia (HCC) 02/08/2016   Chest pain 02/08/2016   Incidental pulmonary nodule 02/08/2016   Anemia 02/08/2016    Past Surgical History:  Procedure Laterality Date   APPENDECTOMY  1990s   COLONOSCOPY N/A 02/11/2016   Procedure: COLONOSCOPY;  Surgeon: [name], MD;  Location: [facility] ENDOSCOPY;  Service: Endoscopy;  Laterality: N/A;   ESOPHAGOGASTRODUODENOSCOPY N/A 02/11/2016   Procedure: ESOPHAGOGASTRODUODENOSCOPY (EGD);  Surgeon: [name], MD;  Location: [facility] ENDOSCOPY;  Service: Endoscopy;  Laterality: N/A;   VIDEO BRONCHOSCOPY Bilateral 02/14/2016   Procedure: VIDEO BRONCHOSCOPY WITHOUT FLUORO;  Surgeon: Alyson Reedy, MD;  Location: [facility] ENDOSCOPY;  Service: Cardiopulmonary;  Laterality: Bilateral;       Family History  Problem Relation Age of Onset   Anemia Neg Hx    Cancer Neg Hx     Social History   Tobacco Use   Smoking status: Never    Smokeless tobacco: Never  Substance Use Topics   Alcohol use: No   Drug use: No    Home Medications Prior to Admission medications   Medication Sig Start Date End Date Taking? Authorizing Provider  dicyclomine (BENTYL) 20 MG tablet Take 1 tablet (20 mg total) by mouth 2 (two) times daily. 11/30/16   Renne Crigler, PA-C  ferrous sulfate 325 (65 FE) MG tablet Take 1 tablet (325 mg total) by mouth 3 (three) times daily with meals. 02/12/16   Regalado, Belkys A, MD  naproxen (NAPROSYN) 500 MG tablet Take 1 tablet (500 mg total) by mouth 2 (two) times daily. 11/30/16   Renne Crigler, PA-C  omeprazole (PRILOSEC) 20 MG capsule Take 1 capsule (20 mg total) by mouth daily. 11/30/16   Renne Crigler, PA-C  Phenyleph-Doxylamine-DM-APAP (NYQUIL SEVERE COLD/FLU) 5-6.25-10-325 MG/15ML LIQD Take 15 mLs by mouth at bedtime. As needed for cough 05/09/16   Little, Ambrose Finland, MD  phenylephrine (NEO-SYNEPHRINE) 1 % nasal spray Place 1 drop into both nostrils every 6 (six) hours as needed for congestion.    [provider]  Pseudoephedrine-APAP-DM (TYLENOL COLD/FLU SEVERE DAY PO) Take 30 mLs by mouth daily as needed. For cold and flu symptoms    [provider]    Allergies    Patient has no known allergies.  Review of Systems   Review of Systems  Constitutional: Negative.   HENT:  Positive for ear discharge and ear pain. Negative for facial swelling, sore  throat and trouble swallowing.   Respiratory: Negative.    Cardiovascular: Negative.   Gastrointestinal: Negative.   Musculoskeletal: Negative.   Neurological: Negative.    Physical Exam Updated Vital Signs BP 122/79 (BP Location: Right Arm)   Pulse 63   Temp 97.9 F (36.6 C) (Oral)   Resp 17   SpO2 100%   Physical Exam Vitals and nursing note reviewed.  Constitutional:      Appearance: He is not ill-appearing or toxic-appearing.  HENT:     Head: Normocephalic and atraumatic.     Right Ear: External ear normal. Drainage  and tenderness present. No swelling. No foreign body. No mastoid tenderness.     Left Ear: Ear canal and external ear normal. Tympanic membrane is perforated.     Ears:      Nose: Nose normal.     Mouth/Throat:     Pharynx: Oropharynx is clear. Uvula midline.  Eyes:     General: Lids are normal. Vision grossly intact. No scleral icterus.       Right eye: No discharge.        Left eye: No discharge.     Extraocular Movements: Extraocular movements intact.     Conjunctiva/sclera: Conjunctivae normal.     Pupils: Pupils are equal, round, and reactive to light.  Cardiovascular:     Rate and Rhythm: Normal rate.     Heart sounds: Normal heart sounds.  Pulmonary:     Effort: Pulmonary effort is normal.  Lymphadenopathy:     Cervical: No cervical adenopathy.  Skin:    General: Skin is warm and dry.  Neurological:     General: No focal deficit present.     Mental Status: He is alert.  Psychiatric:        Mood and Affect: Mood normal.    ED Results / Procedures / Treatments   Labs (all labs ordered are listed, but only abnormal results are displayed) Labs Reviewed - No data to display  EKG None  Radiology No results found.  Procedures Procedures   Medications Ordered in ED Medications - No data to display  ED Course  I have reviewed the triage vital signs and the nursing notes.  Pertinent labs & imaging results that were available during my care of the patient were reviewed by me and considered in my medical decision making (see chart for details).    MDM Rules/Calculators/A&P                         45 year old male with one week of right ear pain and discharge.   Differential diagnosis includes but is not limited to otitis externa, malignant otitis externa, otomycosis, contact dermatitis, otitis media with TM perforation.  Vital signs are normal on intake.  Cardiopulmonary dam is normal.  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 Right TM not able to be visualized induced to significant amount of thick white to yellow discharge present in the right EAC.  No mastoid tenderness but watery and thick discharge both coming from the canal.    As patient symptoms have been present for only 6 days, doubt malignant otitis externa.  No mastoid tenderness to suggest mastoiditis.  Physical exam and HPI most consistent with acute otitis externa.  Given inability to evaluate TM on the right for possible perforation we will proceed  with topical antibiotics with Ciprodex drops.   No further work-up warranted in the ER at this time.  Korbin voiced understanding with medical evaluation and treatment plan.  He was questions answered to his expressed satisfaction.  Return precautions given.  Patient is well-appearing, stable, and appropriate for discharge at this time.  This chart was dictated using voice recognition software, Dragon. Despite the best efforts of this provider to proofread and correct errors, errors may still occur which can change documentation meaning.    Final Clinical Impression(s) / ED Diagnoses Final diagnoses:  None    Rx / DC Orders ED Discharge Orders     None        Sherrilee Gilles 01/16/21 1410    Terald Sleeper, MD 01/16/21 1505

## 2021-01-15 NOTE — Discharge Instructions (Addendum)
You are seen in the ER today for your right ear pain and drainage.  You were diagnosed with otitis externa which is an infection of the skin inside of your ear canal.  You have been prescribed antibiotic eardrops to be placed in your ear twice a day for the next week.  Please use these as prescribed for the entire course.  You may take ibuprofen or Tylenol as needed for your discomfort at home in the interim.  Please do not stick anything into your ear to clean it do not submerge your head in water.  You may follow-up with your primary care doctor and return to the ER with any new severe symptoms.

## 2021-01-15 NOTE — ED Triage Notes (Signed)
C/o R ear pain since he got water in it while taking a shower 1 week ago.

## 2021-01-15 NOTE — ED Provider Notes (Signed)
Emergency Medicine Provider Triage Evaluation Note  MELBERT BOTELHO , a 45 y.o. male  was evaluated in triage.  Pt complains of right ear pain after getting water in it one week ago. Difficulty hearing. No sore throat. No fever chills.  Review of Systems  Positive: Ear pain Negative: Fever, chills  Physical Exam  BP 122/79 (BP Location: Right Arm)   Pulse 63   Temp 97.9 F (36.6 C) (Oral)   Resp 17   SpO2 100%  Gen:   Awake, no distress   Resp:  Normal effort  MSK:   Moves extremities without difficulty  Other:  Difficult to assess right TM because of wax blocking ear canal, ear needs to be cleaned for full TM assessment  Medical Decision Making  Medically screening exam initiated at 2:04 PM.  Appropriate orders placed.  Sharlett Iles Hooton was informed that the remainder of the evaluation will be completed by another provider, this initial triage assessment does not replace that evaluation, and the importance of remaining in the ED until their evaluation is complete.  Ear pain, could not assess TM from triage due to wax build up   Olene Floss, PA-C 01/15/21 1406    Gloris Manchester, MD 01/16/21 [phone]

## 2023-09-26 NOTE — Telephone Encounter (Signed)
 error
